# Patient Record
Sex: Male | Born: 1960 | Race: White | Hispanic: No | Marital: Married | Smoking: Former smoker
Health system: Southern US, Community
[De-identification: ages and names within clinical notes are randomized; demographics above are authoritative.]

## PROBLEM LIST (undated history)

## (undated) DIAGNOSIS — N201 Calculus of ureter: Secondary | ICD-10-CM

## (undated) DIAGNOSIS — Z8719 Personal history of other diseases of the digestive system: Secondary | ICD-10-CM

## (undated) DIAGNOSIS — E785 Hyperlipidemia, unspecified: Secondary | ICD-10-CM

## (undated) DIAGNOSIS — Z8669 Personal history of other diseases of the nervous system and sense organs: Secondary | ICD-10-CM

## (undated) DIAGNOSIS — I2510 Atherosclerotic heart disease of native coronary artery without angina pectoris: Secondary | ICD-10-CM

## (undated) DIAGNOSIS — I10 Essential (primary) hypertension: Secondary | ICD-10-CM

## (undated) DIAGNOSIS — K219 Gastro-esophageal reflux disease without esophagitis: Secondary | ICD-10-CM

## (undated) HISTORY — PX: CARDIAC CATHETERIZATION: SHX172

## (undated) HISTORY — DX: Atherosclerotic heart disease of native coronary artery without angina pectoris: I25.10

## (undated) HISTORY — DX: Essential (primary) hypertension: I10

## (undated) HISTORY — PX: CARDIOVASCULAR STRESS TEST: SHX262

## (undated) HISTORY — PX: CORONARY ANGIOPLASTY WITH STENT PLACEMENT: SHX49

## (undated) HISTORY — DX: Personal history of other diseases of the nervous system and sense organs: Z86.69

## (undated) HISTORY — PX: TONSILLECTOMY: SUR1361

## (undated) HISTORY — DX: Hyperlipidemia, unspecified: E78.5

---

## 2001-05-31 ENCOUNTER — Ambulatory Visit (HOSPITAL_COMMUNITY): Admission: RE | Admit: 2001-05-31 | Discharge: 2001-05-31 | Payer: Self-pay | Admitting: Family Medicine

## 2001-05-31 ENCOUNTER — Encounter: Payer: Self-pay | Admitting: Family Medicine

## 2002-07-22 ENCOUNTER — Emergency Department (HOSPITAL_COMMUNITY): Admission: EM | Admit: 2002-07-22 | Discharge: 2002-07-22 | Payer: Self-pay | Admitting: *Deleted

## 2002-07-30 ENCOUNTER — Ambulatory Visit (HOSPITAL_COMMUNITY): Admission: RE | Admit: 2002-07-30 | Discharge: 2002-07-31 | Payer: Self-pay | Admitting: Cardiology

## 2003-03-31 ENCOUNTER — Encounter: Payer: Self-pay | Admitting: Emergency Medicine

## 2003-03-31 ENCOUNTER — Emergency Department (HOSPITAL_COMMUNITY): Admission: EM | Admit: 2003-03-31 | Discharge: 2003-03-31 | Payer: Self-pay | Admitting: Emergency Medicine

## 2003-04-01 ENCOUNTER — Observation Stay (HOSPITAL_COMMUNITY): Admission: EM | Admit: 2003-04-01 | Discharge: 2003-04-02 | Payer: Self-pay | Admitting: Emergency Medicine

## 2003-04-01 ENCOUNTER — Encounter: Payer: Self-pay | Admitting: Emergency Medicine

## 2003-04-01 ENCOUNTER — Ambulatory Visit (HOSPITAL_COMMUNITY): Admission: RE | Admit: 2003-04-01 | Discharge: 2003-04-01 | Payer: Self-pay | Admitting: Emergency Medicine

## 2003-04-01 ENCOUNTER — Encounter (INDEPENDENT_AMBULATORY_CARE_PROVIDER_SITE_OTHER): Payer: Self-pay | Admitting: *Deleted

## 2003-04-01 HISTORY — PX: LAPAROSCOPIC CHOLECYSTECTOMY: SUR755

## 2003-05-15 ENCOUNTER — Encounter: Payer: Self-pay | Admitting: Emergency Medicine

## 2003-05-15 ENCOUNTER — Observation Stay (HOSPITAL_COMMUNITY): Admission: EM | Admit: 2003-05-15 | Discharge: 2003-05-17 | Payer: Self-pay | Admitting: Emergency Medicine

## 2003-07-16 ENCOUNTER — Emergency Department (HOSPITAL_COMMUNITY): Admission: EM | Admit: 2003-07-16 | Discharge: 2003-07-16 | Payer: Self-pay

## 2004-04-12 ENCOUNTER — Ambulatory Visit (HOSPITAL_COMMUNITY): Admission: RE | Admit: 2004-04-12 | Discharge: 2004-04-12 | Payer: Self-pay | Admitting: Cardiology

## 2004-12-08 ENCOUNTER — Emergency Department (HOSPITAL_COMMUNITY): Admission: EM | Admit: 2004-12-08 | Discharge: 2004-12-08 | Payer: Self-pay | Admitting: Family Medicine

## 2007-12-13 ENCOUNTER — Encounter: Admission: RE | Admit: 2007-12-13 | Discharge: 2008-01-10 | Payer: Self-pay | Admitting: Family Medicine

## 2008-07-04 ENCOUNTER — Observation Stay (HOSPITAL_COMMUNITY): Admission: EM | Admit: 2008-07-04 | Discharge: 2008-07-05 | Payer: Self-pay | Admitting: Emergency Medicine

## 2010-06-07 ENCOUNTER — Ambulatory Visit: Payer: Self-pay | Admitting: Cardiology

## 2010-11-01 ENCOUNTER — Ambulatory Visit: Payer: Self-pay | Admitting: Cardiology

## 2010-11-04 ENCOUNTER — Encounter
Admission: RE | Admit: 2010-11-04 | Discharge: 2010-11-04 | Payer: Self-pay | Source: Home / Self Care | Attending: Family Medicine | Admitting: Family Medicine

## 2010-11-10 ENCOUNTER — Encounter
Admission: RE | Admit: 2010-11-10 | Discharge: 2010-11-10 | Payer: Self-pay | Source: Home / Self Care | Attending: Family Medicine | Admitting: Family Medicine

## 2010-11-13 ENCOUNTER — Encounter
Admission: RE | Admit: 2010-11-13 | Discharge: 2010-11-13 | Payer: Self-pay | Source: Home / Self Care | Attending: Family Medicine | Admitting: Family Medicine

## 2011-03-08 NOTE — Discharge Summary (Signed)
[name], [name] NO.:  [id number]   MEDICAL RECORD NO.:  [id number]          PATIENT TYPE:  OBV   LOCATION:  3707                         FACILITY:  [facility]   PHYSICIAN:  Vesta Mixer, M.D. DATE OF BIRTH:  [birth year]   DATE OF ADMISSION:  07/04/2008  DATE OF DISCHARGE:  07/05/2008                               DISCHARGE SUMMARY   DISCHARGE DIAGNOSES:  1. Noncardiac chest pain.  2. History of coronary artery disease - status post stenting in the      past.  3. Dyslipidemia.   DISCHARGE MEDICATIONS:  1. Lipitor 80 mg at night.  2. Niaspan 1000 mg a day.  3. Toprol-XL 50 mg a day.  4. Plavix 75 mg a day.  5. Aspirin 81 mg a day.  6. Omeprazole 20 mg a day or alternatively.  7. Prevacid 30 mg a day.  8. Fish oil capsules once a day.  9. Men's multivitamin once a day.  10.Tylenol as needed.   DISPOSITION:  The patient will see Dr. Swaziland in the next several weeks.   HISTORY:  Mr. Tim Heath is a 50 year old gentleman with history of coronary  artery disease.  He was admitted for observation last night after having  some episodes of chest pain.  Please see dictated H&P for further  details.   HOSPITAL COURSE:  Chest pain.  The patient was ruled out for myocardial  infarction.  He had no EKG changes.  He is discharged in satisfactory  condition.  We have given him a prescription for Prevacid 30 mg a day  which may be better than the omeprazole in terms of interactions with  Plavix.  He will return to see Dr. Swaziland in several weeks.      Vesta Mixer, M.D.  Electronically Signed     PJN/MEDQ  D:  07/05/2008  T:  07/06/2008  Job:  [id number]   cc:   Peter M. Swaziland, M.D.

## 2011-03-08 NOTE — H&P (Signed)
Tim Heath, LAUDER NO.:  [id number]   MEDICAL RECORD NO.:  [id number]          PATIENT TYPE:  EMS   LOCATION:  MAJO                         FACILITY:  [facility]   PHYSICIAN:  Vesta Mixer, M.D. DATE OF BIRTH:  [birth year]   DATE OF ADMISSION:  07/04/2008  DATE OF DISCHARGE:                              HISTORY & PHYSICAL   HISTORY:  Tim Heath is a 50 year old gentleman with a history of  known coronary artery disease.  He is admitted today with several hours  of atypical chest pain.   Tim Heath has a history of PTCA and stenting of his right coronary  artery in 2003 and his ramus intermediate vessel in 2004.  He has done  fairly well.  Cardiac catheterization in 2005 revealed nonobstructive  disease.  He has overall done fairly well.  I do not have any history of  his evaluation over the past couple of years, but he has not needed  hospitalization.   Today, he was at work and had onset of chest pain.  The pain lasted for  3 hours.  He did not have any sublingual nitroglycerin.  He presented to  the ER, where his pain was quickly relieved with sublingual  nitroglycerin.   He is now admitted for further evaluation and observation.   CURRENT MEDICATIONS:  1. Niaspan 1000 mg a day.  2. Toprol-XL 50 mg a day.  3. Lipitor 80 mg a day.  4. Plavix 75 mg a day.  5. Aspirin 81 mg a day.  6. Omeprazole 20 mg a day.  7. Fish oil 1000 mg a day.   ALLERGIES:  None.   PAST MEDICAL HISTORY:  1. Hyperlipidemia.  2. Hypertension.  3. Coronary artery disease - status post PTCA and stenting of his      right coronary artery and his ramus intermediate vessel.   FAMILY HISTORY:  Noncontributory.   REVIEW OF SYSTEMS:  Reviewed and is essentially negative except as noted  in the HPI.   PHYSICAL EXAMINATION:  GENERAL:  He is a middle-aged gentleman in no  acute distress.  He is alert and oriented x3 and his mood and affect are  normal.  VITAL SIGNS:  His heart  rate 60 and blood pressure 129/86.  NECK:  2+ carotids.  No bruits.  No JVD.  No thyromegaly.  LUNGS:  Clear.  HEART:  Regular rate.  S1 and S2.  ABDOMEN:  Good bowel sounds, nontender.  EXTREMITIES:  There is no clubbing, cyanosis, or edema.  NEUROLOGIC:  Nonfocal.   His EKG reveals normal sinus rhythm.  He has some early repolarization  changes, which were present on his previous EKG in 2004.   His cardiac enzymes are negative x1.   Tim Heath presents with some atypical episodes of chest pain.  He did  not have his nitroglycerin, but the pain was fairly easily relieved here  in the emergency room.  We will admit him to the hospital for further  evaluation and check cardiac enzymes.  If his enzymes are negative, I  think he can go home tomorrow.  If they are abnormal, then he will need  to stay for cardiac catheterization.  All of his other medical problems  remain stable.      Vesta Mixer, M.D.  Electronically Signed     PJN/MEDQ  D:  07/04/2008  T:  07/05/2008  Job:  [id number]   cc:   Peter M. Swaziland, M.D.

## 2011-03-11 NOTE — Discharge Summary (Signed)
Tim Heath, Tim Heath                        ACCOUNT NO.:  [id number]   MEDICAL RECORD NO.:  [id number]                   PATIENT TYPE:  OIB   LOCATION:  6525                                 FACILITY:  [facility]   PHYSICIAN:  Peter M. Swaziland, M.D.               DATE OF BIRTH:  1961-03-10   DATE OF ADMISSION:  07/30/2002  DATE OF DISCHARGE:  07/31/2002                                 DISCHARGE SUMMARY   HISTORY OF PRESENT ILLNESS:  The patient is a 50 year old male with family  history of heart disease, history of hyperlipidemia, who presents with one-  year history of intermittent chest pain and left shoulder pain.  Subsequent  stress Cardiolite study was abnormal showing evidence of inferior and apical  ischemia.   For details of his Past Medical History, Social History, Family History, and  Physical Examination, please see admission History and Physical.   LABORATORY DATA:  Previous chest x-ray showed bibasilar subsegmental  atelectasis and scar.   ECG last was normal.   Coags were normal.  BMET was normal.  CBC was normal.  His lipid panel  showed an LDL of 148.   HOSPITAL COURSE:  The patient underwent cardiac catheterization.  This  demonstrated chronic total occlusion of the proximal right coronary artery.  There was good collateral flow from the left coronary system.  The left  coronary artery showed 30 to 40% disease in the proximal LAD with 20%  irregularity in the left circumflex coronary artery.  Left ventricular  function was normal.  The patient underwent successful intervention of the  right coronary artery.  A stent was placed in the proximal to mid right  coronary artery with a 3.0 x 28 mm Cypher drug-eluting stent.  This yielded  excellent angiographic result with 0% residual stenosis and excellent TIMI-3  flow down the right coronary artery.  Post procedure, the patient was  treated with aspirin, Plavix, and IV Integrilin for 18 hours.  He did have  moderate  groin bruising but no significant hematoma and no bruit.  CBC  remained stable.  His ECG remained normal.  The patient was discharged home  in stable condition on 07/31/2002.   DISCHARGE DIAGNOSES:  1. Atherosclerotic coronary artery disease with chronic total occlusion of     the right coronary artery now status post successful stenting.  2. Hypercholesterolemia.  3. Family history of coronary disease.  4. Obesity.   DISCHARGE MEDICATIONS:  1. Coated aspirin 325 mg per day.  2. Plavix 75 mg daily for at least 3 months.  3. Lipitor 80 mg q.d.  4. Nitroglycerin p.r.n.   DIAGNOSIS:  The patient was instructed to take a low-fat diet.   ACTIVITY:  He is to avoid lifting or straining for five days.    FOLLOW UP:  Follow up with Dr. Swaziland on Tuesday, August 06, 2002.   CONDITION ON DISCHARGE:  Improved.  Peter M. Swaziland, M.D.    PMJ/MEDQ  D:  07/31/2002  T:  08/02/2002  Job:  [id number]   cc:   Stacie Acres. Cliffton Asters, M.D.

## 2011-03-11 NOTE — H&P (Signed)
NAMEJOZEPH, PERSING                        ACCOUNT NO.:  [id number]   MEDICAL RECORD NO.:  [id number]                   PATIENT TYPE:  OBV   LOCATION:  6529                                 FACILITY:  [facility]   PHYSICIAN:  Quita Skye. Waldon Reining, MD             DATE OF BIRTH:  1961/09/15   DATE OF ADMISSION:  05/15/2003  DATE OF DISCHARGE:                                HISTORY & PHYSICAL   HISTORY OF PRESENT ILLNESS:  Tim Heath is a 50 year old white man who  is admitted to [facility] for further evaluation of chest pain.   The patient has a history of coronary artery disease which dated back to  October of 2003.  At that time, he had a positive stress test after  presenting with complaints of chest pain.  He subsequently underwent cardiac  catheterization and deployment of a stent in the right coronary artery.  His  course has been asymptomatic since that time.   However, while sitting at home this afternoon, he experienced the onset of  chest discomfort.  This was described as a pressure in the left anterior  chest.  It was also associated with an ache in his left shoulder, an ache in  his left elbow, and tingling in his left hand.  There were no exacerbating  or ameliorating factors.  The discomfort appeared not to be related to  position, activity, meals, or respirations.  There was no associated  dyspnea, diaphoresis, or nausea.  The symptoms waxed and waned over the  ensuing five hours and eventually resolved spontaneously.  He is free of  symptoms at this time.  The patient noted that this discomfort is the same  as that which preceded his stent deployment.   PAST MEDICAL HISTORY:  The patient has no history of myocardial infarction,  congestive heart failure, or arrhythmia.   The patient has a history of hypertension and hyperlipidemia, both of which  are being treated.   HABITS:  He stopped smoking about 18 years ago.   FAMILY HISTORY:  There is a strong  family history of coronary artery  disease.  His sister suffered from coronary artery disease in her 32s.  His  mother suffered from coronary artery disease at an early age as well.  Family history is otherwise noncontributory.   ALLERGIES:  The patient is not allergic to any medications.   CURRENT MEDICATIONS:  1. Lipitor 80 mg p.o. daily.  2. Toprol-XL 50 mg p.o. daily.  3. Aspirin 325 mg p.o. daily.   SOCIAL HISTORY:  The patient is married and lives with his wife.  He works  on a loading dock.   REVIEW OF SYSTEMS:  Review of systems reveals no new problems related to his  head, eyes, ears, nose, mouth, throat, lungs, gastrointestinal system,  genitourinary system, or extremities.  There is no history of a neurologic  or psychiatric disorder.   PHYSICAL  EXAMINATION:  VITAL SIGNS:  Blood pressure 121/66.  Pulse is 61 and  regular.  Respirations 16.  Temperature 97.7.  GENERAL:  The patient was a middle-aged white man in no discomfort.  He is  alert, oriented, appropriate, and responsive.  HEENT:  Head, eyes, nose and mouth were unremarkable.  NECK:  The neck was without thyromegaly or adenopathy.  Carotid pulses were  palpable bilaterally and without bruits.  CARDIAC:  Examination revealed a normal S1 and S2.  There was no S3, S4,  murmur, rub, or click.  Cardiac rhythm was regular.  No chest wall  tenderness was noted.  LUNGS:  The lungs were clear.  ABDOMEN:  The abdomen was soft and nontender.  There was no mass,  hepatosplenomegaly, bruit, distention, rebound, guarding, or rigidity.  Bowel sounds were normal.  RECTAL AND GENITAL:  Examinations were not performed as they were not  pertinent for the reason for acute care hospitalization.  EXTREMITIES:  The extremities were without edema, deviation or deformity.  Radial and dorsalis pedal pulses were palpable bilaterally.  NEUROLOGIC:  Brief screening neurologic survey was unremarkable.   LABORATORY AND ACCESSORY CLINICAL  DATA:  The electrocardiogram was normal.   The chest radiograph report was pending at the time of this dictation.   The first set of cardiac enzymes revealed the CK-MB of less than 1, a  myoglobin of 47.1, and troponin of less than 0.05.  The second set revealed  a CK-MB of less than 1, a myoglobin of 72.3, and a troponin of less than  0.05.  The third set revealed a CK-MB of less than 1, a myoglobin of 62.9,  and troponin of less than 0.05.  Potassium is 5.2, BUN 17, and creatinine  1.0.  The remaining studies are pending at the time of this dictation.   IMPRESSION:  1. Chest pain, rule out restenosis.  2. Coronary artery disease; status post right coronary artery stent, October     2003.  3. Hypertension.  4. Hyperlipidemia.  5. Status post cholecystectomy one month ago.   PLAN:  1. Telemetry.  2. Serial cardiac enzymes.  3. Aspirin.  4. Lovenox.  5. Nitrol paste.  6. Additional cardiac testing per Dr. Peter M. Swaziland.                                               Quita Skye. Waldon Reining, MD    MSC/MEDQ  D:  05/15/2003  T:  05/16/2003  Job:  [id number]

## 2011-03-11 NOTE — H&P (Signed)
[name], [name]                        ACCOUNT NO.:  [id number]   MEDICAL RECORD NO.:  [id number]                   PATIENT TYPE:  OIB   LOCATION:                                       FACILITY:  [facility]   PHYSICIAN:  Peter M. Swaziland, M.D.               DATE OF BIRTH:  01-25-1961   DATE OF ADMISSION:  04/12/2004  DATE OF DISCHARGE:                                HISTORY & PHYSICAL   HISTORY OF PRESENT ILLNESS:  Tim Heath is a 50 year old Heath male with  history of coronary artery disease, who presents with symptoms of substernal  chest pressure with exertion and left arm and hand tingling.  It is  associated with some breathlessness.  He does have a very physical job and  he has noticed this predominantly when he is having to move a heavy load.  These symptoms have increased in the past several days.  He describes his  chest discomfort as predominantly a tightness.  Tim Heath has a known  history of coronary artery disease.  He is status post stenting of a right  coronary artery total occlusion in October of 2003 with a 3.0 x 28.0-mm  CYPHER drug-eluting stent.  In July of 2004, he had stent in the  intermediate vessel using a 2.5 x 16.0-mm TAXUS stent.  He had a followup  stress Cardiolite study in December of 2004, at which time he was  asymptomatic and there were some equivocal changes anteriorly.  Given his  current symptoms, it is recommended he undergo cardiac catheterization.   PAST MEDICAL HISTORY:  1. Obesity.  2. Hyperlipidemia.  3. Hypertension.   MEDICATIONS:  1. Lipitor 80 mg per day.  2. Niaspan 1000 mg nightly.  3. Multivitamin once daily.  4. Toprol-XL 50 mg per day.  5. Aspirin 325 mg per day.   ALLERGIES:  He has no known allergies.   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  He is married with 2 daughters.  He has lost 29 pounds with diet and  exercise of the past few months.  He quit smoking 18 years  ago.  He denies  alcohol use.   FAMILY HISTORY:  Father died at age 64 with emphysema.  Mother died at age  23 with liver cancer.  One brother has coronary disease and hypertension.  Three siblings are alive and well.  Two sisters died in a motor vehicle  accident.   REVIEW OF SYSTEMS:  His review of systems is otherwise negative.   PHYSICAL EXAM:  GENERAL:  On physical exam, the patient is a pleasant Heath  male in no apparent distress.  VITAL SIGNS:  Blood pressure is 130/80, pulse 80 and regular.  Weight is  205.  HEENT:  Pupils are equal, round and reactive.  Conjunctivae are clear.  Oropharynx is clear.  NECK:  Neck  is without JVD, adenopathy, thyromegaly or bruits.  LUNGS:  Lungs are clear.  CARDIAC:  Exam reveals regular rate and rhythm, normal S1 and S2, without  gallop, murmur, rub or click.  ABDOMEN:  Abdomen is obese, soft and nontender.  His bowel sounds are  positive.  There are no masses.  EXTREMITIES:  Extremities are without edema.  Pulses are 2+ and symmetric.  NEUROLOGIC:  Exam is nonfocal.   LABORATORY DATA:  Chest x-ray shows no active disease.   ECG is normal.   Coagulations are normal.  Chemistry panel shows a BUN of 21, creatinine of  0.9.  His cholesterol was 101, triglycerides 56, HDL was 38, LDL of 52.  All  other chemistries were normal and CBC is normal.   IMPRESSION:  1. Recurrent chest pain, need to rule out ischemia.  2. Status post stenting of the right coronary artery in October of 2003 and     stenting of an intermediate branch in July of 2004.  3. Obesity.  4. Hypertension.  5. Hyperlipidemia.   PLAN:  We will proceed with cardiac catheterization with therapy pending  these results.                                                Peter M. Swaziland, M.D.    PMJ/MEDQ  D:  04/11/2004  T:  04/12/2004  Job:  [id number]   cc:   Tim Heath, M.D.  [address]  [place]  [place] [address]  Fax: [phone]

## 2011-03-11 NOTE — Op Note (Signed)
Tim Heath, Tim Heath                        ACCOUNT NO.:  [id number]   MEDICAL RECORD NO.:  [id number]                   PATIENT TYPE:  INP   LOCATION:  5733                                 FACILITY:  [facility]   PHYSICIAN:  Sharlet Salina T. Hoxworth, M.D.          DATE OF BIRTH:  [birth year]   DATE OF PROCEDURE:  04/01/2003  DATE OF DISCHARGE:                                 OPERATIVE REPORT   PREOPERATIVE DIAGNOSIS:  Acute cholecystitis.   POSTOPERATIVE DIAGNOSIS:  Acute cholecystitis.   OPERATION PERFORMED:  Laparoscopic cholecystectomy.   SURGEON:  Lorne Skeens. Hoxworth, M.D.   ASSISTANT:  Gabrielle Dare. Janee Morn, M.D.   ANESTHESIA:  General.   INDICATIONS FOR PROCEDURE:  The patient is a 50 year old white male who  presents with 24 to 48 hours of right upper quadrant abdominal pain and  nausea.  Work-up includes a gallbladder ultrasound showing a diffusely  thickened gallbladder wall but no stones.  Liver function tests were  essentially normal.  He is felt to have acute cholecystitis, possibly  acalculous.  Laparoscopic cholecystectomy with cholangiogram has been  recommended and accepted.  The nature of the procedure, its indications and  risks of bleeding, infection, bile leak and bile duct injury were discussed  and understood preoperatively.  He is now brought to the operating room for  this procedure.   DESCRIPTION OF PROCEDURE:  The patient was brought to the operating room and  placed in supine position on the operating table and general endotracheal  anesthesia was induced.  He received preoperative antibiotics.  The abdomen  was sterilely prepped and draped.  Local anesthesia was used to infiltrate  the trocar sites prior to the incisions.  A 1 cm incision was made at the  umbilicus.  Dissection was carried down to the midline fascia which was  sharply incised for 1 cm and the peritoneum entered under direct vision.  Through a mattress suture of 0 Vicryl, the Hasson trocar  was placed and a  pneumoperitoneum established.  Under direct vision a 10 mm trocar was placed  in the subxiphoid area and two 5 mm trocars in the right subcostal margin.  The gallbladder was visualized and was acutely edematous and inflamed.  The  fundus was grasped and elevated over the liver and the infundibulum  retracted inferolaterally.  Fibrofatty tissue was stripped down off the neck  of the gallbladder toward the porta hepatis.  There was a lot of edema but  the dissection progressed easily.  The cystic artery was identified coursing  up the gallbladder wall  and was divided between two proximal and distal  clips.  The distal gallbladder in Calot's triangle was thoroughly dissected  and the cystic duct identified and the cystic duct gallbladder junction  delineated.  The cystic duct was very small.  It was clipped at the  gallbladder junction.  The cystic duct was opened in preparation for  cholangiogram but the lumen  was very tiny and would not begin to admit the  Cholangiocath.  We elected to abort the cholangiogram for this reason and  the proximal duct was doubly clipped and the duct divided.  Following this,  the gallbladder was dissected free from its bed using a hook cautery and it  was removed intact through the umbilicus.  Complete hemostasis was obtained  in the gallbladder bed.  The trocar was removed under  direct vision and all CO2 evacuated from the peritoneal cavity.  The  mattress suture was secured at the umbilicus.  Skin incisions were closed  with interrupted subcuticular 4-0 Monocryl and Steri-Strips.  The patient  was then transferred to the recovery room in good condition.                                                Lorne Skeens. Hoxworth, M.D.    Tory Emerald  D:  04/01/2003  T:  04/02/2003  Job:  [id number]

## 2011-03-11 NOTE — H&P (Signed)
**Note Tim Heath** [name], Tim Heath                        ACCOUNT NO.:  [id number]   MEDICAL RECORD NO.:  [id number]                   PATIENT TYPE:  INP   LOCATION:  1856                                 FACILITY:  [facility]   PHYSICIAN:  Sharlet Salina T. Hoxworth, M.D.          DATE OF BIRTH:  08/20/1961   DATE OF ADMISSION:  04/01/2003  DATE OF DISCHARGE:                                HISTORY & PHYSICAL   CHIEF COMPLAINT:  Abdominal pain, nausea, vomiting.   HISTORY OF PRESENT ILLNESS:  Mr. Tim Heath is a 50 year old white male who  presented yesterday to [facility] Room with the abrupt onset of  right upper quadrant abdominal pain, associated with nausea and vomiting.  This is not particularly related to eating.  No fever, chills or jaundice.  The pain was sharp and constant.  He was evaluated in the emergency room and  found to have right upper quadrant tenderness.  CT scan of the abdomen was  obtained.  At that time, it was negative.  Gallbladder ultrasound was  obtained today as an outpatient and he returns for evaluation.  His pain has  improved somewhat, but he remains sore in the right upper quadrant.  He has  low grade nausea without vomiting.  No fever or chills.  He has had several  similar episodes over the last two years but not as severe.  No relation to  food or physical activity particularly.  No fever, chills, jaundice.  No GU  symptoms.   PAST SURGICAL/MEDICAL HISTORY:  Significant only for tonsillectomy.  He has  history of coronary artery disease and underwent PTCA of the right coronary  artery in October by Dr. Swaziland.  He has elevated cholesterol.   MEDICATIONS:  1. Toprol XL 50 mg a day.  2. Lipitor 1 daily.  3. Enteric coated aspirin 325 mg daily.   ALLERGIES:  No known drug allergies.   SOCIAL HISTORY:  Married.  No cigarette or alcohol use.  Works at a loading  dock.   FAMILY HISTORY:  Mother died of liver cancer.   REVIEW OF SYSTEMS:  GENERAL:  No fever,  chills, weight change.  RESPIRATORY:  No shortness of breath, cough, wheezing.  CARDIAC:  No recent chest pain,  palpitations.  No history of MI.  No ankle edema.  ABDOMEN:  As above.   PHYSICAL EXAMINATION:  VITAL SIGNS:  Temperature 98.6, pulse 62 and regular,  respirations 20, blood pressure 137/79.  GENERAL:  Well-nourished, well-developed white male in no acute distress.  SKIN:  Warm and dry without rash or infection.  HEENT:  No palpable masses or thyromegaly.  Sclerae nonicteric.  LUNGS:  Clear to auscultation.  CARDIAC:  Regular rate and rhythm without murmurs.  NECK:  No JVD or edema.  ABDOMEN:  Mild right upper quadrant tenderness.  No guarding, masses or  organomegaly.  EXTREMITIES:  No edema.  No joint swelling.  NEUROLOGIC:  Alert  and oriented, motor and sensory exam, normal.   LABORATORY DATA:  White count elevated at 11.9 thousand, hemoglobin normal.  Electrolytes and LFT's all essentially normal, except for a minimally  elevated ST-T of 44, amylase and lipase normal.   X-rays, CT scan of the abdomen and pelvis yesterday are negative.   Ultrasound of the abdomen today shows diffusely thickened gallbladder wall  with no stones.  Common bile duct appears normal.   ASSESSMENT/PLAN:  Apparent acute cholecystitis, possibly acalculous.  Options were discussed with the patient.  Will plan to proceed today with  laparoscopic cholecystectomy with cholangiogram.  Procedure, indications and  risks were discussed.                                                Lorne Skeens. Hoxworth, M.D.    Tory Emerald  D:  04/01/2003  T:  04/01/2003  Job:  [id number]

## 2011-03-11 NOTE — Cardiovascular Report (Signed)
NAMELARON, BOORMAN                        ACCOUNT NO.:  [id number]   MEDICAL RECORD NO.:  [id number]                   PATIENT TYPE:  OIB   LOCATION:  6525                                 FACILITY:  [facility]   PHYSICIAN:  Peter M. Swaziland, M.D.               DATE OF BIRTH:  December 06, 1960   DATE OF PROCEDURE:  07/30/2002  DATE OF DISCHARGE:  07/31/2002                              CARDIAC CATHETERIZATION   INDICATIONS FOR PROCEDURE:  The patient is a 50 year old white male who  presents with symptoms of exertional angina.  He has an abnormal stress  Cardiolite study showing evidence of inferior wall ischemia.   ACCESS:  Via the right femoral artery using the standard Seldinger  technique.   EQUIPMENT:  The #6 French 4-cm right and left Judkins catheters, #6 French  pigtail catheter, #6 French arterial sheath, #7 [place] arterial sheath, #7  [place] guide with sideholes, and 0.014 Cross-It XT 200 wire, a 3.0 x 15-  mm Maverick over-the-wire balloon, and a 3.0 x 28-mm Cypher drug-eluting  stent.   CONTRAST:  Omnipaque, 250 cc.   MEDICATIONS:  Versed 2 mg IV, double bolus Integrilin at 0.18 mg/kg followed  by continuous infusion 2 mcg/kg per minute, nitroglycerin 200 mcg  intracoronary x3, heparin 5000 units IV, morphine 2 mg IV, and Plavix 300 mg  p.o.   HEMODYNAMIC DATA:  1. Aortic pressure was 135/83 with a mean of 107.  2. Left ventricular pressure was 142 with an EDP of 25.   ANGIOGRAPHIC DATA:  1. The left coronary artery arises and distributes normally.  2. The left main coronary artery was normal.  3. The left anterior descending artery had mild disease proximally up to 30-     40%.  There was also 30% narrowing in a second diagonal branch.  4. The left circumflex coronary artery demonstrated 20% irregularities in     the first marginal branch.  Otherwise no obstructive disease.  5. The right coronary artery was occluded in the proximal to mid vessel.     There were  excellent collaterals from the left coronary system filling     all the way up to the level of occlusion.  6. Left ventricular angiography performed in the RAO view demonstrates     normal left ventricular size and contractility with normal systolic     function.  Ejection fraction is estimated at 65%.  No mitral     regurgitation or prolapse is seen.   INTERVENTIONAL PROCEDURE:  We proceeded at this point to dilate the occluded  right coronary artery.  Using the above equipment, we initially obtained  guide shots.  Intracoronary nitroglycerin did not open the occluded vessel.  We then attempted to cross with a Hi-Torque Floppy wire without success.  The Cross-It XT 200 did cross successfully.  We then predilated the lesion  with the 3.0 x 15-mm Maverick balloon dilating up  to 6 atmospheres and 8  atmospheres.  Prior to balloon dilatation, we did confirm intraluminal  position by injecting contrast through the wire port of the balloon  catheter.  After balloon inflation, it was noted that there was a segmental  area of stenosis.  This was successfully stented using a 3.0 x 28-mm Cypher  drug-eluting stent and post dilating it to 15 atmospheres.  This yielded an  excellent angiographic result with 0% residual stenosis and TIMI grade 3  flow through the distal right coronary artery.  The distal right coronary  artery had only mild wall irregularities.   FINAL INTERPRETATION:  1. Single-vessel occlusive atherosclerotic coronary artery disease.  2. Normal left ventricular function.  3. Successful stenting of the proximal to mid right coronary artery using a     Cypher drug-eluting stent.   PLAN:  Continue aspirin and Plavix for at least three months.                                               Peter M. Swaziland, M.D.    PMJ/MEDQ  D:  08/01/2002  T:  08/04/2002  Job:  [id number]   cc:   Stacie Acres. Cliffton Asters, M.D.

## 2011-03-11 NOTE — Cardiovascular Report (Signed)
Tim Heath, EDEN                        ACCOUNT NO.:  [id number]   MEDICAL RECORD NO.:  [id number]                   PATIENT TYPE:  OBV   LOCATION:  6529                                 FACILITY:  [facility]   PHYSICIAN:  Peter M. Swaziland, M.D.               DATE OF BIRTH:  April 01, 1961   DATE OF PROCEDURE:  05/16/2003  DATE OF DISCHARGE:  05/17/2003                              CARDIAC CATHETERIZATION   INDICATION FOR PROCEDURE:  The patient is a 50 year old white male with a  history of coronary artery disease, status post prior stenting of chronic  total occlusion of the right coronary artery in October 2003.  He presents  with recurrent unstable angina.   ACCESS:  Access is via the right femoral artery using standard Seldinger  technique.   EQUIPMENT:  6 French 4 cm right and left Judkins catheters, 6 French pigtail  catheter, 6 French arterial sheath, 6 [place] JL4 guide, 0.014 high-torque  floppy wire, a 2.5 x 15 mm CrossSail balloon and a 2.5 x 16 mm Taxus Express  II drug-eluting stent.   CONTRAST:  200 mL of Omnipaque.   MEDICATIONS:  Heparin 5000 units IV, Versed 2 mg IV, Integrilin double bolus  at 0.18 mg/kg followed by continuous infusion at 2.24 mcg/kg per minute,  nitroglycerin 200 mcg intracoronary, Plavix 300 mg p.o.   HEMODYNAMIC DATA:  1. Aortic pressure was 98/61 with a mean of 77.  2. Left ventricular pressure was 124 with EDP of 19.   ANGIOGRAPHIC DATA:  1. The left coronary arises and distributes normally.  2. The left main coronary artery appears normal.  3. Left anterior descending artery has somewhat diffuse mild irregularities.     There was as 40-50% stenosis in the proximal LAD at the takeoff of the     first septal perforator.  The first diagonal branch has a 50% stenosis in     the midvessel.  4. There was a large intermediate branch, which has a 90% stenosis     segmentally in the proximal vessel.  5. The left circumflex coronary artery  appears to be without significant     disease.  6. The right coronary artery arises and distributes normally.  It is a     dominant vessel.  It has scattered 20% disease throughout the vessel.     The previous stent in the midvessel is widely patent.  7. The left ventricular angiography in the RAO view demonstrated normal left     ventricular size and contractility with normal systolic function.     Ejection fraction is estimated at 65%.  There is no mitral regurgitation     or prolapse.   We proceeded at this point with intervention of the large intermediate  branch.  This lesion was crossed easily and pre-dilated using the 2.5 mm  CrossSail balloon up to 8 atmospheres.  We then placed the 2.5 x  16 mm Taxus  stent and post-dilated that to 14 atmospheres.  This revealed an excellent  angiographic result with 0% residual stenosis and TIMI grade 3 flow.  The  patient had no complications.   FINAL INTERPRETATION:  1. Single-vessel obstructive atherosclerotic coronary artery disease with a     new lesion in the intermediate branch.  2.     Continued long-term patency of the previous stent in the right coronary     artery.  3. Normal left ventricular function.  4. Successful stenting of the proximal intermediate branch.                                               Peter M. Swaziland, M.D.    PMJ/MEDQ  D:  05/16/2003  T:  05/18/2003  Job:  [id number]  Stacie Acres. White, M.D.  [address]  [place]  [place] [address]  Fax: [phone]   cc:   Stacie Acres. White, M.D.  [address]  [place]  [place] [address]  Fax: [phone]

## 2011-03-11 NOTE — H&P (Signed)
Tim Heath, Tim Heath                        ACCOUNT NO.:  [id number]   MEDICAL RECORD NO.:  [id number]                   PATIENT TYPE:  OIB   LOCATION:  6525                                 FACILITY:  [facility]   PHYSICIAN:  Peter M. Swaziland, M.D.               DATE OF BIRTH:  05/09/1961   DATE OF ADMISSION:  07/30/2002  DATE OF DISCHARGE:                                HISTORY & PHYSICAL   HISTORY OF PRESENT ILLNESS:  The patient is a 50 year old white male who has  been experiencing symptoms of intermittent chest pain, left shoulder and  neck pain for the past year.  The patient states he had x-rays done of his  neck and shoulder that were negative.  He has been tried on anti-  inflammatory and pain medications without benefit.  Over the past three to  four months, his symptoms have been progressive and are clearly now  exertional.  He describes a mid substernal chest pain associated with  shortness of breath and radiation to his left arm and shoulder.  He also has  complaints of increased fatigability.  Because of this, the patient was  referred for a stress Cardiolite study which was performed on July 26, 2002.  He was able to exercise for six minutes on the Bruce protocol.  At  this level he experiences typical anginal symptoms and had 2 mm of ST  segment depression inferolaterally.  His Cardiolite images also demonstrated  evidence of inferior ischemia as well as a defect at the apex.  His ejection  fraction was 58%.  Based on these results, cardiac catheterization has been  recommended.   PAST MEDICAL HISTORY:  This is significant for hypercholesterolemia,  obesity, history of Bell's Palsy.   PAST SURGICAL HISTORY:  He has had prior tonsillectomy and vasectomy.   ALLERGIES:  No known drug allergies.   CURRENT MEDICATIONS:  1. Enteric coated aspirin daily.  2. Lipitor 40 mg per day.  3. Naproxen p.r.n.  4. Darvocet-N 100 p.r.n.  5. Nexium 40 mg per day.   SOCIAL  HISTORY:  The patient works on a loading dock.  He is married and has  two healthy children.  He quit smoking 16 years ago and previously was a one-  pack-per-day smoker.  He denies alcohol use.   FAMILY HISTORY:  His father died at age 16 of emphysema.  Mother died at age  44 with liver cancer.  One brother is age 67, has had coronary artery  disease and hypertension.  Two sisters died in a motor vehicle accident.  Three siblings are currently alive and well.   REVIEW OF SYSTEMS:  The patient denies any claudication.  He has had no  orthopnea or PND.  He denies any TIA or CVA symptoms.  No recent bowel or  bladder complaints.  Other review of systems are negative.   PHYSICAL EXAMINATION:  GENERAL APPEARANCE:  The patient is an obese white  male in no apparent distress.  VITAL SIGNS:  Weight is 245, blood pressure is 148/94, pulse 74 and regular.  HEENT:  Pupils are equal, round and reactive.  Conjunctivae are clear.  Oropharynx is clear.  NECK:  Supple without JVD, adenopathy, thyromegaly or bruits.  LUNGS:  Clear to auscultation and percussion.  CARDIOVASCULAR:  Regular rate and rhythm, normal S1 and S2 without gallops,  murmurs, rubs or clicks.  ABDOMEN:  Soft, obese and nontender, no hepatosplenomegaly, masses or  bruits.  EXTREMITIES:  Femoral and pedal pulses are 2+ and symmetric.  NEUROLOGIC:  Nonfocal.   LABORATORY DATA:  Resting ECG demonstrates normal sinus rhythm, normal ECG.   Other labs are pending.   IMPRESSION:  1. Symptoms of angina pectoris with abnormal stress Cardiolite study at the     low level.  2. Obesity.  3. Hypercholesterolemia.  4. Borderline hypertension.  5. Family history of coronary artery disease.   PLAN:  The patient will be admitted for cardiac catheterization with further  therapy pending these results.                                                Peter M. Swaziland, M.D.    PMJ/MEDQ  D:  07/29/2002  T:  07/31/2002  Job:  [id number]    cc:   Stacie Acres. Cliffton Asters, M.D.

## 2011-03-11 NOTE — Cardiovascular Report (Signed)
Tim Heath                        ACCOUNT NO.:  [id number]   MEDICAL RECORD NO.:  [id number]                   PATIENT TYPE:  OIB   LOCATION:  2899                                 FACILITY:  [facility]   PHYSICIAN:  Tim Heath, M.D.               DATE OF BIRTH:  1961/03/04   DATE OF PROCEDURE:  04/12/2004  DATE OF DISCHARGE:  04/12/2004                              CARDIAC CATHETERIZATION   PROCEDURE PERFORMED:  Cardiac catheterization.   CARDIOLOGIST:  Tim Heath, M.D.   INDICATIONS FOR PROCEDURE:  Tim Heath is a 50 year old Heath male with  history of coronary artery disease status post prior stenting of the right  coronary artery in October 2003 and stenting of the intermediate branch in  July 2004 who presents now with recurrent chest pain.   ACCESS:  Access is via the right femoral artery using the standard Seldinger  technique.   EQUIPMENT:  Six French 4 cm right and left Judkins catheters. Six French  pigtail catheter.  Six French arterial sheath.   MEDICATIONS:  Local anesthesia with 1% lidocaine.   CONTRAST MATERIAL:  One-hundred-ten milliliters Omnipaque.   HEMODYNAMIC DATA:  Aortic pressure 117/65 with a mean of 89 mmHg.  Left  ventricular pressure 111 with an EDP of 25 mmHg.   ANGIOGRAPHIC DATA:  Left Coronary Artery:  The left coronary artery rises  and distributes normally.   Left Main Coronary Artery:  The left main coronary artery appears normal.   Left Anterior Descending Artery:  The left anterior descending artery has 30-  40% narrowing at the takeoff of the first septal perforator.  The remainder  of the vessel has mild diffuse irregularities.  The first diagonal branch  has 20% narrowing proximally.   There is a large intermediate branch, which is widely patent at the previous  stent site. There is no obstructive disease.   Left Circumflex Coronary Artery:  The left circumflex coronary artery  appears normal.   Right Coronary  Artery:  The right coronary artery is a dominant vessel.  It  has diffuse 10-20% irregularities throughout the vessel, but the stent is  still widely patent.   Left Ventricular Angiography:  Left ventricular angiography was performed in  the RAO view.  This demonstrated normal left ventricular size and  contractility with normal systolic function.  Ejection fraction is estimated  at 55%.  There is no mitral regurgitation or prolapse.   FINAL INTERPRETATION:  1. Nonobstructive coronary artery disease.  2. Continued excellent patency of the stents in the intermediate branch and     right coronary artery.  3. Normal left ventricular function.   PLAN:  We will continue medical therapy.  Tim Heath, M.D.    PMJ/MEDQ  D:  04/12/2004  T:  04/13/2004  Job:  [id number]   cc:   Tim Heath, M.D.  [address]  [place]  [place] [address]  Fax: [phone]

## 2011-04-05 ENCOUNTER — Emergency Department (HOSPITAL_COMMUNITY)
Admission: EM | Admit: 2011-04-05 | Discharge: 2011-04-05 | Disposition: A | Discharge status: Home / Self Care | Payer: Worker's Compensation | Attending: Emergency Medicine | Admitting: Emergency Medicine

## 2011-04-05 DIAGNOSIS — Y990 Civilian activity done for income or pay: Secondary | ICD-10-CM | POA: Insufficient documentation

## 2011-04-05 DIAGNOSIS — E785 Hyperlipidemia, unspecified: Secondary | ICD-10-CM | POA: Insufficient documentation

## 2011-04-05 DIAGNOSIS — I10 Essential (primary) hypertension: Secondary | ICD-10-CM | POA: Insufficient documentation

## 2011-04-05 DIAGNOSIS — W268XXA Contact with other sharp object(s), not elsewhere classified, initial encounter: Secondary | ICD-10-CM | POA: Insufficient documentation

## 2011-04-05 DIAGNOSIS — K219 Gastro-esophageal reflux disease without esophagitis: Secondary | ICD-10-CM | POA: Insufficient documentation

## 2011-04-05 DIAGNOSIS — Z79899 Other long term (current) drug therapy: Secondary | ICD-10-CM | POA: Insufficient documentation

## 2011-04-05 DIAGNOSIS — S61409A Unspecified open wound of unspecified hand, initial encounter: Secondary | ICD-10-CM | POA: Insufficient documentation

## 2011-04-05 DIAGNOSIS — I2510 Atherosclerotic heart disease of native coronary artery without angina pectoris: Secondary | ICD-10-CM | POA: Insufficient documentation

## 2011-05-02 ENCOUNTER — Ambulatory Visit: Payer: Self-pay | Admitting: Cardiology

## 2011-05-02 ENCOUNTER — Other Ambulatory Visit: Payer: Self-pay | Admitting: *Deleted

## 2011-05-03 ENCOUNTER — Other Ambulatory Visit: Payer: Self-pay | Admitting: *Deleted

## 2011-05-03 DIAGNOSIS — E119 Type 2 diabetes mellitus without complications: Secondary | ICD-10-CM

## 2011-05-03 DIAGNOSIS — E785 Hyperlipidemia, unspecified: Secondary | ICD-10-CM

## 2011-05-19 ENCOUNTER — Encounter: Payer: Self-pay | Admitting: Cardiology

## 2011-05-20 ENCOUNTER — Ambulatory Visit (INDEPENDENT_AMBULATORY_CARE_PROVIDER_SITE_OTHER): Payer: Self-pay | Admitting: Cardiology

## 2011-05-20 ENCOUNTER — Other Ambulatory Visit (INDEPENDENT_AMBULATORY_CARE_PROVIDER_SITE_OTHER): Payer: Managed Care, Other (non HMO) | Admitting: *Deleted

## 2011-05-20 ENCOUNTER — Encounter: Payer: Self-pay | Admitting: Cardiology

## 2011-05-20 VITALS — BP 128/90 | HR 70 | Ht 68.0 in | Wt 277.0 lb

## 2011-05-20 DIAGNOSIS — E785 Hyperlipidemia, unspecified: Secondary | ICD-10-CM

## 2011-05-20 DIAGNOSIS — E119 Type 2 diabetes mellitus without complications: Secondary | ICD-10-CM

## 2011-05-20 DIAGNOSIS — I2510 Atherosclerotic heart disease of native coronary artery without angina pectoris: Secondary | ICD-10-CM

## 2011-05-20 DIAGNOSIS — I10 Essential (primary) hypertension: Secondary | ICD-10-CM

## 2011-05-20 LAB — LIPID PANEL
LDL Cholesterol: 58 mg/dL (ref 0–99)
Total CHOL/HDL Ratio: 3
Triglycerides: 60 mg/dL (ref 0.0–149.0)

## 2011-05-20 LAB — BASIC METABOLIC PANEL
Chloride: 103 mEq/L (ref 96–112)
Creatinine, Ser: 0.7 mg/dL (ref 0.4–1.5)
Potassium: 3.9 mEq/L (ref 3.5–5.1)

## 2011-05-20 LAB — HEPATIC FUNCTION PANEL
ALT: 39 U/L (ref 0–53)
Bilirubin, Direct: 0 mg/dL (ref 0.0–0.3)
Total Bilirubin: 0.8 mg/dL (ref 0.3–1.2)

## 2011-05-20 NOTE — Patient Instructions (Signed)
Continue current medications.  We will call with the results of your lab work.  We will schedule you for a nuclear stress test.  I will see you again in 6 months.

## 2011-05-20 NOTE — Assessment & Plan Note (Signed)
I am very encouraged with his weight loss. Have encouraged him to continue with his dietary and lifestyle modification.

## 2011-05-20 NOTE — Assessment & Plan Note (Signed)
He remains asymptomatic at this time. It has been 3 years since his last stress test so we will set him up for a nuclear stress test.

## 2011-05-20 NOTE — Assessment & Plan Note (Signed)
He is currently on Lipitor, niacin, and fish oil. Will check fasting lab work today.

## 2011-05-20 NOTE — Assessment & Plan Note (Signed)
Blood pressure is well controlled on current medications. We will continue Toprol-XL.

## 2011-05-20 NOTE — Progress Notes (Signed)
   Tim Heath Date of Birth: 1961/08/20   History of Present Illness: Tim Heath is seen today for followup. He is doing much better with his diet. He is trying to lose weight slowly and has lost 17 pounds since his last visit 6 months ago. He denies any symptoms of chest pain, shortness of breath, palpitations. He has had no edema.  Current Outpatient Prescriptions on File Prior to Visit  Medication Sig Dispense Refill  . Acetaminophen (TYLENOL ARTHRITIS PAIN PO) Take by mouth 2 (two) times daily.        Marland Kitchen aspirin 81 MG tablet Take 81 mg by mouth daily.        Marland Kitchen atorvastatin (LIPITOR) 80 MG tablet Take 80 mg by mouth daily.        . clopidogrel (PLAVIX) 75 MG tablet Take 75 mg by mouth daily.        . fish oil-omega-3 fatty acids 1000 MG capsule Take 1 g by mouth daily.        . metoprolol (TOPROL-XL) 50 MG 24 hr tablet Take 50 mg by mouth daily.        . Multiple Vitamin (MULTIVITAMIN) tablet Take 1 tablet by mouth daily.        . niacin (NIASPAN) 1000 MG CR tablet Take 1,000 mg by mouth at bedtime.        Marland Kitchen omeprazole (PRILOSEC) 20 MG capsule Take 20 mg by mouth daily.        . Tamsulosin HCl (FLOMAX) 0.4 MG CAPS Take 0.4 mg by mouth daily.          No Known Allergies  Past Medical History  Diagnosis Date  . Hypertension   . Hyperlipidemia   . Coronary artery disease   . Morbid obesity   . History of Bell's palsy     Past Surgical History  Procedure Date  . Cardiac catheterization 04/12/2004    EF 55%  . Tonsillectomy   . Vasectomy     History  Smoking status  . Former Smoker  Smokeless tobacco  . Not on file    History  Alcohol Use No    Family History  Problem Relation Age of Onset  . Liver cancer Mother   . Emphysema Father   . Hypertension Brother   . Coronary artery disease Brother     Review of Systems: The review of systems is positive for recent laceration of the finger on his right hand.  All other systems were reviewed and are  negative.  Physical Exam: BP 128/90  Pulse 70  Ht 5\' 8"  (1.727 m)  Wt 277 lb (125.646 kg)  BMI 42.12 kg/m2 He is an obese white male in no acute distress. He is normocephalic, atraumatic. Pupils are equal round and reactive to light and accommodation. Extraocular movements are full. Oropharynx is clear. Neck is supple no JVD, adenopathy, thyromegaly, or bruits. Lungs are clear. Cardiac exam reveals a regular rate and rhythm without gallop, murmur, or click. Abdomen is obese, soft, nontender. He has no edema. Pedal pulses are good. Skin is warm and dry. He is alert and oriented x3. Cranial nerves II through XII are intact. LABORATORY DATA:   Assessment / Plan:

## 2011-05-26 ENCOUNTER — Telehealth: Payer: Self-pay | Admitting: *Deleted

## 2011-05-26 NOTE — Telephone Encounter (Signed)
Notified of lab results. Will send copy to Dr. Tiburcio Pea

## 2011-05-26 NOTE — Telephone Encounter (Signed)
Message copied by Lorayne Bender on Thu May 26, 2011 11:32 AM ------      Message from: [place], PETER M      Created: Fri May 20, 2011  5:20 PM       Chemistries, A1c, and lipids all look great!

## 2011-05-30 ENCOUNTER — Encounter: Payer: Self-pay | Admitting: *Deleted

## 2011-06-01 ENCOUNTER — Telehealth: Payer: Self-pay | Admitting: Cardiology

## 2011-06-01 NOTE — Telephone Encounter (Signed)
Faxed last EKG today to [phone] to [place] prior to pt appt on Monday 8/13

## 2011-06-01 NOTE — Telephone Encounter (Signed)
Tim Heath needs last EKG faxed by Monday 8/13 for his visit at [facility]

## 2011-06-06 ENCOUNTER — Ambulatory Visit (HOSPITAL_COMMUNITY): Payer: Managed Care, Other (non HMO) | Attending: Cardiology | Admitting: Radiology

## 2011-06-06 VITALS — Ht 68.0 in | Wt 276.0 lb

## 2011-06-06 DIAGNOSIS — I2510 Atherosclerotic heart disease of native coronary artery without angina pectoris: Secondary | ICD-10-CM | POA: Insufficient documentation

## 2011-06-06 DIAGNOSIS — R0609 Other forms of dyspnea: Secondary | ICD-10-CM

## 2011-06-06 MED ORDER — TECHNETIUM TC 99M TETROFOSMIN IV KIT
33.0000 | PACK | Freq: Once | INTRAVENOUS | Status: AC | PRN
  Administered 2011-06-06: 33 via INTRAVENOUS

## 2011-06-06 NOTE — Progress Notes (Signed)
[facility] SITE 3 NUCLEAR MED [address] [place] [address] [phone]  Cardiology Nuclear Med Study  Tim Heath is a 50 y.o. male [id number] 03/02/1961   Nuclear Med Background Indication for Stress Test:  Evaluation for Ischemia History: '05 Heart Catheterization: EF 55% patent stents, '09 Myocardial Perfusion Study: EF 62%  and 2003: RCA/2004 Stents:  Cardiac Risk Factors: Family History - CAD, History of Smoking, Hypertension and Lipids  Symptoms:  DOE and Fatigue   Nuclear Pre-Procedure Caffeine/Decaff Intake:  None NPO After: 10:00pm   Lungs:  clear IV 0.9% NS with Angio Cath:  20g  IV Site: R Wrist  IV Started by:  Stanton Kidney, EMT-P  Chest Size (in):  50 Cup Size: n/a  Height: 5\' 8"  (1.727 m)  Weight:  276 lb (125.193 kg)  BMI:  Body mass index is 41.97 kg/(m^2). Tech Comments:  Toprol held > 36 hours, per patient.    Nuclear Med Study 1 or 2 day study: 2 day  Stress Test Type:  Stress  Reading MD: Charlton Haws MD  Order Authorizing Provider:  P.Jordan  Resting Radionuclide: Technetium 27m Tetrofosmin  Resting Radionuclide Dose: 33 mCi   Stress Radionuclide:  Technetium 70m Tetrofosmin  Stress Radionuclide Dose: 33 mCi           Stress Protocol Rest HR: 73 Stress HR: 173  Rest BP: 131/78 Stress BP: 210/88  Exercise Time (min): 6:00 METS: 7.0   Predicted Max HR: 169 bpm % Max HR: 102.37 bpm Rate Pressure Product: 21308   Dose of Adenosine (mg):  n/a Dose of Lexiscan: n/a mg  Dose of Atropine (mg): n/a Dose of Dobutamine: n/a mcg/kg/min (at max HR)  Stress Test Technologist: Milana Na, EMT-P  Nuclear Technologist:  Domenic Polite, CNMT     Rest Procedure:  Myocardial perfusion imaging was performed at rest 45 minutes following the intravenous administration of Technetium 50m Tetrofosmin. Rest ECG: NSR  Stress Procedure:  The patient exercised for 6:00.  The patient stopped due to fatigue and denied any chest pain.   There were no significant ST-T wave changes.  Technetium 79m Tetrofosmin was injected at peak exercise and myocardial perfusion imaging was performed after a brief delay. Stress ECG: No significant change from baseline ECG  QPS Raw Data Images:  Normal; no motion artifact; normal heart/lung ratio. Stress Images:  Normal homogeneous uptake in all areas of the myocardium. Rest Images:  Normal homogeneous uptake in all areas of the myocardium. Subtraction (SDS):  Normal Transient Ischemic Dilatation (Normal <1.22): .77 Lung/Heart Ratio (Normal <0.45):  .35  Quantitative Gated Spect Images QGS EDV:  100 ml QGS ESV:  34 ml QGS cine images:  NL LV Function; NL Wall Motion QGS EF: 66%  Impression Exercise Capacity:  Fair exercise capacity. BP Response:  Hypertensive blood pressure response. Clinical Symptoms:  No chest pain. ECG Impression:  No significant ST segment change suggestive of ischemia. Comparison with Prior Nuclear Study: No images to compare  Overall Impression:  Normal stress nuclear study.    Charlton Haws

## 2011-06-07 ENCOUNTER — Ambulatory Visit (HOSPITAL_COMMUNITY): Payer: Managed Care, Other (non HMO) | Attending: Cardiology | Admitting: Radiology

## 2011-06-07 DIAGNOSIS — R0989 Other specified symptoms and signs involving the circulatory and respiratory systems: Secondary | ICD-10-CM

## 2011-06-07 MED ORDER — TECHNETIUM TC 99M TETROFOSMIN IV KIT
33.0000 | PACK | Freq: Once | INTRAVENOUS | Status: AC | PRN
  Administered 2011-06-07: 33 via INTRAVENOUS

## 2011-06-08 NOTE — Progress Notes (Signed)
nuc med report routed to Dr.Jordan 06/08/11 [place]  

## 2011-06-09 ENCOUNTER — Telehealth: Payer: Self-pay | Admitting: Cardiology

## 2011-06-09 NOTE — Telephone Encounter (Signed)
Received call from patient asking for Stress test results.  Pt had st on 06/06/11/06/07/11.  Advised Dr. Swaziland out and test will have to be reviewed by him.  Advised may not hear until 06/15/2011.  This is FYI.

## 2011-06-09 NOTE — Telephone Encounter (Signed)
Noted. Will call him when Dr. Swaziland reviews test,

## 2011-06-12 NOTE — Progress Notes (Signed)
Normal nuclear stress test. EF is normal. Keep working on losing weight and regular exercise. Tim Heath

## 2011-06-13 NOTE — Progress Notes (Signed)
Notified of stress test results. Will send copy to Dr. Holley Bouche

## 2011-07-27 LAB — TROPONIN I: Troponin I: <0.01

## 2011-07-27 LAB — CBC
MCHC: 33.8
RBC: 4.72
RDW: 13.2

## 2011-07-27 LAB — DIFFERENTIAL
Basophils Absolute: 0
Basophils Relative: 1
Monocytes Relative: 9
Neutro Abs: 3.1
Neutrophils Relative %: 51

## 2011-07-27 LAB — POCT CARDIAC MARKERS
CKMB, poc: <1 — ABNORMAL LOW
Myoglobin, poc: 58.1
Troponin i, poc: <0.05

## 2011-07-27 LAB — CARDIAC PANEL(CRET KIN+CKTOT+MB+TROPI)
CK, MB: 1
Relative Index: 0.9
Troponin I: <0.01

## 2011-07-27 LAB — POCT I-STAT, CHEM 8
HCT: 45
Hemoglobin: 15.3
Potassium: 4
Sodium: 141

## 2011-07-27 LAB — D-DIMER, QUANTITATIVE: D-Dimer, Quant: 0.31

## 2011-12-06 ENCOUNTER — Ambulatory Visit: Payer: Managed Care, Other (non HMO) | Admitting: Cardiology

## 2012-01-23 ENCOUNTER — Telehealth: Payer: Self-pay | Admitting: Cardiology

## 2012-01-24 ENCOUNTER — Ambulatory Visit: Payer: Managed Care, Other (non HMO) | Admitting: Cardiology

## 2012-01-24 NOTE — Telephone Encounter (Signed)
Error

## 2012-02-08 ENCOUNTER — Other Ambulatory Visit: Payer: Self-pay

## 2012-02-08 MED ORDER — NIACIN ER (ANTIHYPERLIPIDEMIC) 1000 MG PO TBCR: 1000.0000 mg | EXTENDED_RELEASE_TABLET | Freq: Every day | ORAL | Status: DC

## 2012-02-08 MED ORDER — ATORVASTATIN CALCIUM 80 MG PO TABS: 80.0000 mg | ORAL_TABLET | Freq: Every day | ORAL | Status: DC

## 2012-02-09 ENCOUNTER — Other Ambulatory Visit: Payer: Self-pay | Admitting: *Deleted

## 2012-02-09 MED ORDER — ATORVASTATIN CALCIUM 80 MG PO TABS: 80.0000 mg | ORAL_TABLET | Freq: Every day | ORAL | Status: DC

## 2012-02-09 NOTE — Telephone Encounter (Signed)
Refilled atorvastatin

## 2012-02-10 ENCOUNTER — Other Ambulatory Visit: Payer: Self-pay | Admitting: *Deleted

## 2012-02-13 ENCOUNTER — Other Ambulatory Visit: Payer: Self-pay | Admitting: Cardiology

## 2012-02-13 MED ORDER — ATORVASTATIN CALCIUM 80 MG PO TABS: 80.0000 mg | ORAL_TABLET | Freq: Every day | ORAL | Status: DC

## 2012-02-13 MED ORDER — METOPROLOL SUCCINATE ER 50 MG PO TB24: 50.0000 mg | ORAL_TABLET | Freq: Every day | ORAL | Status: DC

## 2012-02-14 ENCOUNTER — Other Ambulatory Visit: Payer: Self-pay

## 2012-02-14 ENCOUNTER — Other Ambulatory Visit: Payer: Self-pay | Admitting: *Deleted

## 2012-02-14 MED ORDER — CLOPIDOGREL BISULFATE 75 MG PO TABS: 75.0000 mg | ORAL_TABLET | Freq: Every day | ORAL | Status: DC

## 2012-02-14 MED ORDER — NIACIN ER (ANTIHYPERLIPIDEMIC) 1000 MG PO TBCR: 1000.0000 mg | EXTENDED_RELEASE_TABLET | Freq: Every day | ORAL | Status: DC

## 2012-03-22 ENCOUNTER — Encounter: Payer: Self-pay | Admitting: Cardiology

## 2012-03-22 ENCOUNTER — Ambulatory Visit (INDEPENDENT_AMBULATORY_CARE_PROVIDER_SITE_OTHER): Payer: Managed Care, Other (non HMO) | Admitting: Cardiology

## 2012-03-22 ENCOUNTER — Other Ambulatory Visit: Payer: Self-pay

## 2012-03-22 VITALS — BP 120/76 | HR 64 | Ht 68.0 in | Wt 292.4 lb

## 2012-03-22 DIAGNOSIS — E785 Hyperlipidemia, unspecified: Secondary | ICD-10-CM

## 2012-03-22 DIAGNOSIS — I10 Essential (primary) hypertension: Secondary | ICD-10-CM

## 2012-03-22 DIAGNOSIS — I2510 Atherosclerotic heart disease of native coronary artery without angina pectoris: Secondary | ICD-10-CM

## 2012-03-22 NOTE — Assessment & Plan Note (Signed)
Remote stenting of the right coronary and intermediate branch. He remains asymptomatic. Stress Myoview study in August of 2012 was normal. Continue current therapy.

## 2012-03-22 NOTE — Assessment & Plan Note (Signed)
Blood pressure is under excellent control. 

## 2012-03-22 NOTE — Assessment & Plan Note (Signed)
Lab work reviewed from his last visit. Continue current lipid-lowering therapy. We will recheck fasting lab work in 6 months.

## 2012-03-22 NOTE — Assessment & Plan Note (Signed)
Discussed importance of weight loss. No symptoms of sleep apnea.

## 2012-03-22 NOTE — Progress Notes (Signed)
   Tim Heath Date of Birth: January 07, 1961   History of Present Illness: Tim Heath is seen today for followup. He is doing well from a cardiac standpoint. He denies any chest pain or shortness of breath. He's working very hard. He has gained 15 pounds since his last visit. He denies any difficulty sleeping or snoring. His wife actually uses CPAP therapy but he states he's never had any problems.  Current Outpatient Prescriptions on File Prior to Visit  Medication Sig Dispense Refill  . Acetaminophen (TYLENOL ARTHRITIS PAIN PO) Take by mouth 2 (two) times daily.        Marland Kitchen aspirin 81 MG tablet Take 81 mg by mouth daily.        Marland Kitchen atorvastatin (LIPITOR) 80 MG tablet Take 1 tablet (80 mg total) by mouth daily.  90 tablet  0  . clopidogrel (PLAVIX) 75 MG tablet Take 1 tablet (75 mg total) by mouth daily.  90 tablet  3  . fish oil-omega-3 fatty acids 1000 MG capsule Take 1 g by mouth daily.        . metoprolol succinate (TOPROL-XL) 50 MG 24 hr tablet Take 1 tablet (50 mg total) by mouth daily.  90 tablet  0  . Multiple Vitamin (MULTIVITAMIN) tablet Take 1 tablet by mouth daily.        . niacin (NIASPAN) 1000 MG CR tablet Take 1 tablet (1,000 mg total) by mouth at bedtime.  90 tablet  1  . omeprazole (PRILOSEC) 20 MG capsule Take 20 mg by mouth daily.        . Tamsulosin HCl (FLOMAX) 0.4 MG CAPS Take 0.4 mg by mouth daily.          No Known Allergies  Past Medical History  Diagnosis Date  . Hypertension   . Hyperlipidemia   . Coronary artery disease   . Morbid obesity   . History of Bell's palsy     Past Surgical History  Procedure Date  . Cardiac catheterization 04/12/2004    EF 55%  . Tonsillectomy   . Vasectomy     History  Smoking status  . Former Smoker  Smokeless tobacco  . Not on file    History  Alcohol Use No    Family History  Problem Relation Age of Onset  . Liver cancer Mother   . Emphysema Father   . Hypertension Brother   . Coronary artery disease Brother      Review of Systems: As noted in history of present illness.  All other systems were reviewed and are negative.  Physical Exam: BP 120/76  Pulse 64  Ht 5\' 8"  (1.727 m)  Wt 292 lb 6.4 oz (132.632 kg)  BMI 44.46 kg/m2 He is an obese white male in no acute distress. He is normocephalic, atraumatic. Pupils are equal round and reactive to light and accommodation. Extraocular movements are full. Oropharynx is clear. Neck is supple no JVD, adenopathy, thyromegaly, or bruits. Lungs are clear. Cardiac exam reveals a regular rate and rhythm without gallop, murmur, or click. Abdomen is obese, soft, nontender. He has no edema. Pedal pulses are good. Skin is warm and dry. He is alert and oriented x3. Cranial nerves II through XII are intact. LABORATORY DATA: ECG today is normal. His last nuclear stress test in August 2012 was normal. Complete laboratory data at that time was also normal with his lipids been under excellent control and an A1c of 6%  Assessment / Plan:

## 2012-03-22 NOTE — Patient Instructions (Signed)
Continue your current therapy  I will see you again in 6 months with fasting lab work   

## 2012-05-11 ENCOUNTER — Other Ambulatory Visit: Payer: Self-pay | Admitting: *Deleted

## 2012-05-11 MED ORDER — METOPROLOL SUCCINATE ER 50 MG PO TB24: 50.0000 mg | ORAL_TABLET | Freq: Every day | ORAL | Status: DC

## 2012-08-14 ENCOUNTER — Other Ambulatory Visit: Payer: Self-pay

## 2012-08-14 ENCOUNTER — Other Ambulatory Visit: Payer: Self-pay | Admitting: Cardiology

## 2012-08-14 MED ORDER — OMEPRAZOLE 20 MG PO CPDR: 20.0000 mg | DELAYED_RELEASE_CAPSULE | Freq: Every day | ORAL | Status: DC

## 2012-08-14 MED ORDER — ATORVASTATIN CALCIUM 80 MG PO TABS: 80.0000 mg | ORAL_TABLET | Freq: Every day | ORAL | Status: DC

## 2012-08-14 MED ORDER — METOPROLOL SUCCINATE ER 50 MG PO TB24: 50.0000 mg | ORAL_TABLET | Freq: Every day | ORAL | Status: DC

## 2012-08-14 MED ORDER — CLOPIDOGREL BISULFATE 75 MG PO TABS: 75.0000 mg | ORAL_TABLET | Freq: Every day | ORAL | Status: DC

## 2012-08-14 MED ORDER — TAMSULOSIN HCL 0.4 MG PO CAPS: 0.4000 mg | ORAL_CAPSULE | Freq: Every day | ORAL | Status: DC

## 2012-08-14 MED ORDER — NIACIN ER (ANTIHYPERLIPIDEMIC) 1000 MG PO TBCR: 1000.0000 mg | EXTENDED_RELEASE_TABLET | Freq: Every day | ORAL | Status: DC

## 2012-08-14 MED ORDER — OMEGA-3 FATTY ACIDS 1000 MG PO CAPS: 1.0000 g | ORAL_CAPSULE | Freq: Every day | ORAL | Status: DC

## 2012-08-20 ENCOUNTER — Other Ambulatory Visit: Payer: Self-pay | Admitting: Cardiology

## 2012-08-20 MED ORDER — METOPROLOL SUCCINATE ER 50 MG PO TB24: 50.0000 mg | ORAL_TABLET | Freq: Every day | ORAL | Status: DC

## 2012-08-20 MED ORDER — NIACIN ER (ANTIHYPERLIPIDEMIC) 1000 MG PO TBCR: 1000.0000 mg | EXTENDED_RELEASE_TABLET | Freq: Every day | ORAL | Status: DC

## 2012-08-20 MED ORDER — CLOPIDOGREL BISULFATE 75 MG PO TABS: 75.0000 mg | ORAL_TABLET | Freq: Every day | ORAL | Status: DC

## 2012-08-20 MED ORDER — ATORVASTATIN CALCIUM 80 MG PO TABS: 80.0000 mg | ORAL_TABLET | Freq: Every day | ORAL | Status: DC

## 2012-08-20 MED ORDER — TAMSULOSIN HCL 0.4 MG PO CAPS: 0.4000 mg | ORAL_CAPSULE | Freq: Every day | ORAL | Status: DC

## 2012-08-20 NOTE — Telephone Encounter (Signed)
New problem:  [facility] on [address] -30 days supply .  Or do the office have any samples

## 2012-08-20 NOTE — Telephone Encounter (Signed)
.  the patient has a recall letter in the appointments to be seen by 09/18/2012. Return call to home no answer to LVM. Pt need to be schedule for appointment for future refills.

## 2012-11-16 ENCOUNTER — Other Ambulatory Visit: Payer: Self-pay | Admitting: *Deleted

## 2012-11-16 MED ORDER — METOPROLOL SUCCINATE ER 50 MG PO TB24: 50.0000 mg | ORAL_TABLET | Freq: Every day | ORAL | Status: DC

## 2013-01-14 ENCOUNTER — Encounter: Payer: Self-pay | Admitting: Cardiology

## 2013-01-16 ENCOUNTER — Telehealth: Payer: Self-pay | Admitting: Cardiology

## 2013-01-16 NOTE — Telephone Encounter (Signed)
Returned call to patient he stated he has been having a dull pain in left hand radiates up into left elbow and shoulder.States this pain started today off and on.States this morning pain lasted appox 1 hr.No pain at present.No chest pain.States just don't feel good.States this pain is similar to the pain he had when he had stents.Also complains of drooling for 2 weeks.No numbness in face.Appointment scheduled with Dr.Jordan tomorrow 01/17/13 at 9:00 am.Patient stated he is at work now but don't feel like working,has a stressful job.Advised to go home and rest, go to ER if needed.

## 2013-01-16 NOTE — Telephone Encounter (Signed)
New problem   Pt is experience numbness left arm and drooling.Pt is at work and pt's wife is calling for pt. Please call pt's wife concerning this matter

## 2013-01-17 ENCOUNTER — Other Ambulatory Visit: Payer: Self-pay | Admitting: Cardiology

## 2013-01-17 ENCOUNTER — Encounter: Payer: Self-pay | Admitting: Cardiology

## 2013-01-17 ENCOUNTER — Ambulatory Visit
Admission: RE | Admit: 2013-01-17 | Discharge: 2013-01-17 | Disposition: A | Discharge status: Home / Self Care | Payer: Managed Care, Other (non HMO) | Source: Ambulatory Visit | Attending: Cardiology | Admitting: Cardiology

## 2013-01-17 ENCOUNTER — Ambulatory Visit (INDEPENDENT_AMBULATORY_CARE_PROVIDER_SITE_OTHER): Payer: Managed Care, Other (non HMO) | Admitting: Cardiology

## 2013-01-17 VITALS — BP 126/80 | HR 70 | Ht 68.0 in | Wt 295.8 lb

## 2013-01-17 DIAGNOSIS — I2510 Atherosclerotic heart disease of native coronary artery without angina pectoris: Secondary | ICD-10-CM

## 2013-01-17 DIAGNOSIS — E785 Hyperlipidemia, unspecified: Secondary | ICD-10-CM

## 2013-01-17 DIAGNOSIS — I10 Essential (primary) hypertension: Secondary | ICD-10-CM

## 2013-01-17 DIAGNOSIS — I200 Unstable angina: Secondary | ICD-10-CM

## 2013-01-17 LAB — BASIC METABOLIC PANEL
CO2: 28 mEq/L (ref 19–32)
Chloride: 102 mEq/L (ref 96–112)
Creatinine, Ser: 0.7 mg/dL (ref 0.4–1.5)

## 2013-01-17 LAB — CBC WITH DIFFERENTIAL/PLATELET
Basophils Relative: 0.1 % (ref 0.0–3.0)
Eosinophils Absolute: 0.2 10*3/uL (ref 0.0–0.7)
Hemoglobin: 16.2 g/dL (ref 13.0–17.0)
MCHC: 33.4 g/dL (ref 30.0–36.0)
MCV: 96.9 fl (ref 78.0–100.0)
Monocytes Absolute: 0.6 10*3/uL (ref 0.1–1.0)
Neutro Abs: 3.9 10*3/uL (ref 1.4–7.7)
Neutrophils Relative %: 55.6 % (ref 43.0–77.0)
RBC: 5.01 Mil/uL (ref 4.22–5.81)

## 2013-01-17 MED ORDER — CLOPIDOGREL BISULFATE 75 MG PO TABS: 75.0000 mg | ORAL_TABLET | Freq: Every day | ORAL | Status: DC

## 2013-01-17 MED ORDER — NIACIN ER (ANTIHYPERLIPIDEMIC) 1000 MG PO TBCR: 1000.0000 mg | EXTENDED_RELEASE_TABLET | Freq: Every day | ORAL | Status: DC

## 2013-01-17 MED ORDER — TAMSULOSIN HCL 0.4 MG PO CAPS: 0.4000 mg | ORAL_CAPSULE | Freq: Every day | ORAL | Status: DC

## 2013-01-17 MED ORDER — NITROGLYCERIN 0.4 MG SL SUBL: 0.4000 mg | SUBLINGUAL_TABLET | SUBLINGUAL | Status: DC | PRN

## 2013-01-17 MED ORDER — METOPROLOL SUCCINATE ER 50 MG PO TB24: 50.0000 mg | ORAL_TABLET | Freq: Every day | ORAL | Status: DC

## 2013-01-17 MED ORDER — OMEPRAZOLE 20 MG PO CPDR: 20.0000 mg | DELAYED_RELEASE_CAPSULE | Freq: Every day | ORAL | Status: DC

## 2013-01-17 MED ORDER — ATORVASTATIN CALCIUM 80 MG PO TABS: 80.0000 mg | ORAL_TABLET | Freq: Every day | ORAL | Status: DC

## 2013-01-17 NOTE — Progress Notes (Signed)
 Tim Heath Date of Birth: 10/31/1960   History of Present Illness: Tim is seen today as a work in for evaluation of chest pain. He has a history of coronary disease with stenting of the right coronary in 2003 with a Cypher stent. He had stenting of the ramus intermediate branch in 2004 with a Taxus stent. Cardiac catheterization 2005 showed nonobstructive disease. His last Myoview study in August of 2012 was normal. Yesterday while at work he complained of pain in his left hand radiating to his elbow and shoulder. This is described as a dull pain. It lasted one hour and then eased off. He did not take any nitroglycerin. The symptoms are similar to his prior anginal symptoms. Prior to this he's had a couple of episodes where he has both chest pain and left arm pain associated with stress and activity. 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 Another day he states he just felt really bad.  Current Outpatient Prescriptions on File Prior to Visit  Medication Sig Dispense Refill  . Acetaminophen (TYLENOL ARTHRITIS PAIN PO) Take by mouth 2 (two) times daily.        . aspirin 81 MG tablet Take 81 mg by mouth daily.        . fish oil-omega-3 fatty acids 1000 MG capsule Take 1 capsule (1 g total) by mouth daily.  90 capsule  1  . Multiple Vitamin (MULTIVITAMIN) tablet Take 1 tablet by mouth daily.         No current facility-administered medications on file prior to visit.    No Known Allergies  Past Medical History  Diagnosis Date  . Hypertension   . Hyperlipidemia   . Coronary artery disease     DES RCA 2003, DES intermediate 2004  . Morbid obesity   . History of Bell's palsy     Past Surgical History  Procedure Laterality Date  . Cardiac catheterization  04/12/2004    EF 55%  . Tonsillectomy    . Vasectomy      History  Smoking status  . Former Smoker  Smokeless tobacco  . Not on file    History  Alcohol Use No    Family History  Problem  Relation Age of Onset  . Liver cancer Mother   . Emphysema Father   . Hypertension Brother   . Coronary artery disease Brother     Review of Systems: As noted in history of present illness. He does note that he has some drooling from the left side of his mouth. He has a history of Bell's palsy in the remote past. All other systems were reviewed and are negative.  Physical Exam: BP 126/80  Pulse 70  Ht 5' 8" (1.727 m)  Wt 295 lb 12.8 oz (134.174 kg)  BMI 44.99 kg/m2 He is an obese white male in no acute distress. He is normocephalic, atraumatic. Pupils are equal round and reactive to light and accommodation. Extraocular movements are full. Oropharynx is clear. Neck is supple no JVD, adenopathy, thyromegaly, or bruits. Lungs are clear. Cardiac exam reveals a regular rate and rhythm without gallop, murmur, or click. Abdomen is obese, soft, nontender. He has no edema. Pedal pulses are good. Skin is warm and dry. He is alert and oriented x3. Cranial nerves II through XII are intact. There is no facial droop. LABORATORY DATA: ECG today is normal. Rate is 70 beats per minute.  Assessment / Plan: 1. Crescendo angina. It has been   10 years since his last cardiac catheterization. He had significant obstructive disease at a very young age. His work is very physically demanding. For these reasons I recommended proceeding directly to cardiac catheterization to define his coronary anatomy. He will be scheduled for this study tomorrow. We will check baseline lab work today and chest x-ray. He will continue with his current medications. 2. Morbid obesity. 3. Hyperlipidemia. He had recent lab work done by his primary care. We will obtain a copy. Continue high-dose atorvastatin and niacin. 4. Hypertension-continue metoprolol 

## 2013-01-18 ENCOUNTER — Encounter (HOSPITAL_BASED_OUTPATIENT_CLINIC_OR_DEPARTMENT_OTHER): Payer: Self-pay

## 2013-01-18 ENCOUNTER — Encounter (HOSPITAL_BASED_OUTPATIENT_CLINIC_OR_DEPARTMENT_OTHER)
Admission: RE | Disposition: A | Discharge status: Home / Self Care | Payer: Self-pay | Source: Ambulatory Visit | Attending: Cardiology

## 2013-01-18 ENCOUNTER — Inpatient Hospital Stay (HOSPITAL_BASED_OUTPATIENT_CLINIC_OR_DEPARTMENT_OTHER)
Admission: RE | Admit: 2013-01-18 | Discharge: 2013-01-18 | Disposition: A | Discharge status: Home / Self Care | Payer: Managed Care, Other (non HMO) | Source: Ambulatory Visit | Attending: Cardiology | Admitting: Cardiology

## 2013-01-18 DIAGNOSIS — I200 Unstable angina: Secondary | ICD-10-CM

## 2013-01-18 DIAGNOSIS — Z9861 Coronary angioplasty status: Secondary | ICD-10-CM | POA: Insufficient documentation

## 2013-01-18 DIAGNOSIS — I2510 Atherosclerotic heart disease of native coronary artery without angina pectoris: Secondary | ICD-10-CM | POA: Insufficient documentation

## 2013-01-18 DIAGNOSIS — E785 Hyperlipidemia, unspecified: Secondary | ICD-10-CM | POA: Insufficient documentation

## 2013-01-18 DIAGNOSIS — I10 Essential (primary) hypertension: Secondary | ICD-10-CM | POA: Insufficient documentation

## 2013-01-18 DIAGNOSIS — R079 Chest pain, unspecified: Secondary | ICD-10-CM | POA: Insufficient documentation

## 2013-01-18 SURGERY — JV LEFT HEART CATHETERIZATION WITH CORONARY ANGIOGRAM
Anesthesia: Moderate Sedation

## 2013-01-18 MED ORDER — SODIUM CHLORIDE 0.9 % IV SOLN
INTRAVENOUS | Status: DC
  Administered 2013-01-18: 11:00:00 via INTRAVENOUS

## 2013-01-18 MED ORDER — SODIUM CHLORIDE 0.9 % IJ SOLN: 3.0000 mL | Freq: Two times a day (BID) | INTRAMUSCULAR | Status: DC

## 2013-01-18 MED ORDER — ONDANSETRON HCL 4 MG/2ML IJ SOLN: 4.0000 mg | Freq: Four times a day (QID) | INTRAMUSCULAR | Status: DC | PRN

## 2013-01-18 MED ORDER — SODIUM CHLORIDE 0.9 % IJ SOLN: 3.0000 mL | INTRAMUSCULAR | Status: DC | PRN

## 2013-01-18 MED ORDER — SODIUM CHLORIDE 0.9 % IV SOLN: 1.0000 mL/kg/h | INTRAVENOUS | Status: DC

## 2013-01-18 MED ORDER — SODIUM CHLORIDE 0.9 % IV SOLN: 250.0000 mL | INTRAVENOUS | Status: DC | PRN

## 2013-01-18 MED ORDER — ACETAMINOPHEN 325 MG PO TABS: 650.0000 mg | ORAL_TABLET | ORAL | Status: DC | PRN

## 2013-01-18 MED ORDER — ASPIRIN 81 MG PO CHEW
324.0000 mg | CHEWABLE_TABLET | ORAL | Status: AC
  Administered 2013-01-18: 324 mg via ORAL

## 2013-01-18 NOTE — Interval H&P Note (Signed)
History and Physical Interval Note:  01/18/2013 11:38 AM  Tim Heath  has presented today for surgery, with the diagnosis of chest pain  The various methods of treatment have been discussed with the patient and family. After consideration of risks, benefits and other options for treatment, the patient has consented to  Procedure(s): JV LEFT HEART CATHETERIZATION WITH CORONARY ANGIOGRAM (N/A) as a surgical intervention .  The patient's history has been reviewed, patient examined, no change in status, stable for surgery.  I have reviewed the patient's chart and labs.  Questions were answered to the patient's satisfaction.     Theron Arista [facility] 01/18/2013 11:38 AM

## 2013-01-18 NOTE — Progress Notes (Signed)
Bedrest begins @ 1220, tegaderm dressing applied to right groin site by Army Melia, site level 0.

## 2013-01-18 NOTE — H&P (View-Only) (Signed)
Tim Heath Date of Birth: 15-May-1961   History of Present Illness: Tim Heath is seen today as a work in for evaluation of chest pain. He has a history of coronary disease with stenting of the right coronary in 2003 with a Cypher stent. He had stenting of the ramus intermediate branch in 2004 with a Taxus stent. Cardiac catheterization 2005 showed nonobstructive disease. His last Myoview study in August of 2012 was normal. Yesterday while at work he complained of pain in his left hand radiating to his elbow and shoulder. This is described as a dull pain. It lasted one hour and then eased off. He did not take any nitroglycerin. The symptoms are similar to his prior anginal symptoms. Prior to this he's had a couple of episodes where he has both chest pain and left arm pain associated with stress and activity. 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 Another day he states he just felt really bad.  Current Outpatient Prescriptions on File Prior to Visit  Medication Sig Dispense Refill  . Acetaminophen (TYLENOL ARTHRITIS PAIN PO) Take by mouth 2 (two) times daily.        Marland Kitchen aspirin 81 MG tablet Take 81 mg by mouth daily.        . fish oil-omega-3 fatty acids 1000 MG capsule Take 1 capsule (1 g total) by mouth daily.  90 capsule  1  . Multiple Vitamin (MULTIVITAMIN) tablet Take 1 tablet by mouth daily.         No current facility-administered medications on file prior to visit.    No Known Allergies  Past Medical History  Diagnosis Date  . Hypertension   . Hyperlipidemia   . Coronary artery disease     DES RCA 2003, DES intermediate 2004  . Morbid obesity   . History of Bell's palsy     Past Surgical History  Procedure Laterality Date  . Cardiac catheterization  04/12/2004    EF 55%  . Tonsillectomy    . Vasectomy      History  Smoking status  . Former Smoker  Smokeless tobacco  . Not on file    History  Alcohol Use No    Family History  Problem  Relation Age of Onset  . Liver cancer Mother   . Emphysema Father   . Hypertension Brother   . Coronary artery disease Brother     Review of Systems: As noted in history of present illness. He does note that he has some drooling from the left side of his mouth. He has a history of Bell's palsy in the remote past. All other systems were reviewed and are negative.  Physical Exam: BP 126/80  Pulse 70  Ht 5\' 8"  (1.727 m)  Wt 295 lb 12.8 oz (134.174 kg)  BMI 44.99 kg/m2 He is an obese white male in no acute distress. He is normocephalic, atraumatic. Pupils are equal round and reactive to light and accommodation. Extraocular movements are full. Oropharynx is clear. Neck is supple no JVD, adenopathy, thyromegaly, or bruits. Lungs are clear. Cardiac exam reveals a regular rate and rhythm without gallop, murmur, or click. Abdomen is obese, soft, nontender. He has no edema. Pedal pulses are good. Skin is warm and dry. He is alert and oriented x3. Cranial nerves II through XII are intact. There is no facial droop. LABORATORY DATA: ECG today is normal. Rate is 70 beats per minute.  Assessment / Plan: 1. Crescendo angina. It has been  10 years since his last cardiac catheterization. He had significant obstructive disease at a very young age. His work is very physically demanding. For these reasons I recommended proceeding directly to cardiac catheterization to define his coronary anatomy. He will be scheduled for this study tomorrow. We will check baseline lab work today and chest x-ray. He will continue with his current medications. 2. Morbid obesity. 3. Hyperlipidemia. He had recent lab work done by his primary care. We will obtain a copy. Continue high-dose atorvastatin and niacin. 4. Hypertension-continue metoprolol

## 2013-01-18 NOTE — Progress Notes (Signed)
Became slightly dizzy and hypotensive, placed back in bed, IV fluids increased and blood pressure back up to 119/77.

## 2013-01-18 NOTE — CV Procedure (Signed)
   Cardiac Catheterization Procedure Note  Name: ZAYVEN POWE MRN: [id number] DOB: 11-23-1960  Procedure: Left Heart Cath, Selective Coronary Angiography, LV angiography  Indication: 52 year old white male with history of coronary artery disease status post stenting of the right coronary in 2003 and the ramus intermediate branch and 2004.  He presents with left arm pain similar to his prior anginal pain.   Procedural details: The right groin was prepped, draped, and anesthetized with 1% lidocaine. Using modified Seldinger technique, a 5 French sheath was introduced into the right femoral artery. Standard Judkins catheters were used for coronary angiography and left ventriculography. Catheter exchanges were performed over a guidewire. There were no immediate procedural complications. The patient was transferred to the post catheterization recovery area for further monitoring.  Procedural Findings: Hemodynamics:  AO 129/72 with a mean of 97 mmHg LV 134/22 mmHg   Coronary angiography: Coronary dominance: right  Left mainstem: Mildly calcified.  No significant disease.  Left anterior descending (LAD): There is a 50% stenosis at the takeoff of the first septal perforator.  The remainder of the LAD and diagonals were without significant disease.  Ramus intermediate branch is widely patent at the prior stent site.  Left circumflex (LCx): Left circumflex has only mild irregularities.  Right coronary artery (RCA): The right coronary is a large dominant vessel.  In the mid vessel there is a focal area of 30% in-stent disease.  There diffuse irregularities in the distal RCA.  Left ventriculography: Left ventricular systolic function is normal, LVEF is estimated at 55-65%, there is no significant mitral regurgitation   Final Conclusions:   1.  Nonobstructive coronary disease.  Continued patency of the stents in the ramus intermediate branch and RCA. 2.  Normal LV  function.  Recommendations: Continue medical management.  Theron Arista [facility] 01/18/2013, 12:10 PM

## 2013-01-18 NOTE — Progress Notes (Signed)
Allen's test performed on right hand  Which was negative.

## 2013-02-05 ENCOUNTER — Encounter: Payer: Self-pay | Admitting: Cardiology

## 2013-02-08 ENCOUNTER — Encounter: Payer: Managed Care, Other (non HMO) | Admitting: Nurse Practitioner

## 2013-03-11 ENCOUNTER — Encounter: Payer: Managed Care, Other (non HMO) | Admitting: Nurse Practitioner

## 2013-07-08 ENCOUNTER — Encounter: Payer: Self-pay | Admitting: Cardiology

## 2013-07-08 ENCOUNTER — Ambulatory Visit (INDEPENDENT_AMBULATORY_CARE_PROVIDER_SITE_OTHER): Payer: Managed Care, Other (non HMO) | Admitting: Cardiology

## 2013-07-08 VITALS — BP 150/90 | HR 69 | Ht 68.0 in | Wt 299.0 lb

## 2013-07-08 DIAGNOSIS — E785 Hyperlipidemia, unspecified: Secondary | ICD-10-CM

## 2013-07-08 DIAGNOSIS — I10 Essential (primary) hypertension: Secondary | ICD-10-CM

## 2013-07-08 DIAGNOSIS — I2510 Atherosclerotic heart disease of native coronary artery without angina pectoris: Secondary | ICD-10-CM

## 2013-07-08 NOTE — Progress Notes (Signed)
[name] Date of Birth: 1961-05-01   History of Present Illness: Tim Heath is seen today for followup. He has a history of coronary disease with stenting of the right coronary in 2003 with a Cypher stent. He had stenting of the ramus intermediate branch in 2004 with a Taxus stent. Cardiac catheterization in March 2014 showed nonobstructive disease. He states he has done very well. He denies any chest pain or shortness of breath. He is still very active and has a very strenuous job.  Current Outpatient Prescriptions on File Prior to Visit  Medication Sig Dispense Refill  . Acetaminophen (TYLENOL ARTHRITIS PAIN PO) Take by mouth 2 (two) times daily.        Marland Kitchen aspirin 81 MG tablet Take 81 mg by mouth daily.        Marland Kitchen atorvastatin (LIPITOR) 80 MG tablet Take 1 tablet (80 mg total) by mouth daily.  90 tablet  3  . clopidogrel (PLAVIX) 75 MG tablet Take 1 tablet (75 mg total) by mouth daily.  90 tablet  3  . fish oil-omega-3 fatty acids 1000 MG capsule Take 1 capsule (1 g total) by mouth daily.  90 capsule  1  . metoprolol succinate (TOPROL-XL) 50 MG 24 hr tablet Take 1 tablet (50 mg total) by mouth daily.  90 tablet  3  . Multiple Vitamin (MULTIVITAMIN) tablet Take 1 tablet by mouth daily.        . nitroGLYCERIN (NITROSTAT) 0.4 MG SL tablet Place 1 tablet (0.4 mg total) under the tongue every 5 (five) minutes as needed for chest pain.  100 tablet  6  . omeprazole (PRILOSEC) 20 MG capsule Take 1 capsule (20 mg total) by mouth daily.  90 capsule  3  . tamsulosin (FLOMAX) 0.4 MG CAPS Take 1 capsule (0.4 mg total) by mouth daily.  90 capsule  3   No current facility-administered medications on file prior to visit.    No Known Allergies  Past Medical History  Diagnosis Date  . Hypertension   . Hyperlipidemia   . Coronary artery disease     DES RCA 2003, DES intermediate 2004  . Morbid obesity   . History of Bell's palsy     Past Surgical History  Procedure Laterality Date  . Cardiac  catheterization  04/12/2004    EF 55%  . Tonsillectomy    . Vasectomy      History  Smoking status  . Former Smoker  Smokeless tobacco  . Not on file    History  Alcohol Use No    Family History  Problem Relation Age of Onset  . Liver cancer Mother   . Emphysema Father   . Hypertension Brother   . Coronary artery disease Brother     Review of Systems: As noted in history of present illness. All other systems were reviewed and are negative.  Physical Exam: BP 150/90  Pulse 69  Ht 5\' 8"  (1.727 m)  Wt 299 lb (135.626 kg)  BMI 45.47 kg/m2  SpO2 95% He is an obese white male in no acute distress. He is normocephalic, atraumatic. Pupils are equal round and reactive to light and accommodation. Extraocular movements are full. Oropharynx is clear. Neck is supple no JVD, adenopathy, thyromegaly, or bruits. Lungs are clear. Cardiac exam reveals a regular rate and rhythm without gallop, murmur, or click. Abdomen is obese, soft, nontender. He has no edema. Pedal pulses are good. Skin is warm and dry. He is alert and oriented x3.  Cranial nerves II through XII are intact. There is no facial droop.  LABORATORY DATA:   Assessment / Plan: 1. Coronary disease status post stenting procedures as noted in history of present illness. Cardiac catheterization in March showed nonobstructive disease. We'll continue with risk factor modification. 2. Morbid obesity. 3. Hyperlipidemia. Recommend continued high-dose statin therapy. Based on recent data I recommended stopping Niaspan. 4. Hypertension-continue metoprolol

## 2013-07-08 NOTE — Patient Instructions (Signed)
You can stop taking Niaspan  Continue your other therapy  I will see you in 6 months.

## 2014-02-06 ENCOUNTER — Other Ambulatory Visit: Payer: Self-pay

## 2014-02-06 MED ORDER — METOPROLOL SUCCINATE ER 50 MG PO TB24: 50.0000 mg | ORAL_TABLET | Freq: Every day | ORAL | Status: DC

## 2014-02-06 MED ORDER — CLOPIDOGREL BISULFATE 75 MG PO TABS: 75.0000 mg | ORAL_TABLET | Freq: Every day | ORAL | Status: DC

## 2014-02-06 MED ORDER — OMEPRAZOLE 20 MG PO CPDR: 20.0000 mg | DELAYED_RELEASE_CAPSULE | Freq: Every day | ORAL | Status: DC

## 2014-02-06 MED ORDER — ATORVASTATIN CALCIUM 80 MG PO TABS: 80.0000 mg | ORAL_TABLET | Freq: Every day | ORAL | Status: DC

## 2014-03-27 ENCOUNTER — Ambulatory Visit (INDEPENDENT_AMBULATORY_CARE_PROVIDER_SITE_OTHER): Payer: Managed Care, Other (non HMO) | Admitting: Cardiology

## 2014-03-27 ENCOUNTER — Encounter: Payer: Self-pay | Admitting: Cardiology

## 2014-03-27 ENCOUNTER — Other Ambulatory Visit: Payer: Self-pay

## 2014-03-27 VITALS — BP 148/100 | HR 55 | Ht 68.0 in | Wt 290.0 lb

## 2014-03-27 DIAGNOSIS — I10 Essential (primary) hypertension: Secondary | ICD-10-CM

## 2014-03-27 DIAGNOSIS — I2510 Atherosclerotic heart disease of native coronary artery without angina pectoris: Secondary | ICD-10-CM

## 2014-03-27 DIAGNOSIS — E785 Hyperlipidemia, unspecified: Secondary | ICD-10-CM

## 2014-03-27 MED ORDER — OMEPRAZOLE 20 MG PO CPDR: 20.0000 mg | DELAYED_RELEASE_CAPSULE | Freq: Every day | ORAL | Status: DC

## 2014-03-27 MED ORDER — METOPROLOL SUCCINATE ER 50 MG PO TB24: 50.0000 mg | ORAL_TABLET | Freq: Every day | ORAL | Status: DC

## 2014-03-27 MED ORDER — ATORVASTATIN CALCIUM 80 MG PO TABS: 80.0000 mg | ORAL_TABLET | Freq: Every day | ORAL | Status: DC

## 2014-03-27 MED ORDER — CLOPIDOGREL BISULFATE 75 MG PO TABS: 75.0000 mg | ORAL_TABLET | Freq: Every day | ORAL | Status: DC

## 2014-03-27 NOTE — Addendum Note (Signed)
Addended by: Tonita Phoenix on: 03/27/2014 04:02 PM   Modules accepted: Orders

## 2014-03-27 NOTE — Progress Notes (Signed)
Tim Heath Date of Birth: [birth year]   History of Present Illness: Tim Heath is seen today for followup. He has a history of coronary disease with stenting of the right coronary in 2003 with a Cypher stent. He had stenting of the ramus intermediate branch in 2004 with a Taxus stent. Cardiac catheterization in March 2014 showed nonobstructive disease. He states he has done very well. 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 This is much less stressful. He is doing better with his diet and has lost 9 lbs. Rarely has chest pain related to stress.  Current Outpatient Prescriptions on File Prior to Visit  Medication Sig Dispense Refill  . Acetaminophen (TYLENOL ARTHRITIS PAIN PO) Take by mouth 2 (two) times daily.        Marland Kitchen. aspirin 81 MG tablet Take 81 mg by mouth daily.        Marland Kitchen. atorvastatin (LIPITOR) 80 MG tablet Take 1 tablet (80 mg total) by mouth daily.  90 tablet  0  . clopidogrel (PLAVIX) 75 MG tablet Take 1 tablet (75 mg total) by mouth daily.  90 tablet  0  . metoprolol succinate (TOPROL-XL) 50 MG 24 hr tablet Take 1 tablet (50 mg total) by mouth daily.  90 tablet  0  . Multiple Vitamin (MULTIVITAMIN) tablet Take 1 tablet by mouth daily.        . nitroGLYCERIN (NITROSTAT) 0.4 MG SL tablet Place 1 tablet (0.4 mg total) under the tongue every 5 (five) minutes as needed for chest pain.  100 tablet  6  . omeprazole (PRILOSEC) 20 MG capsule Take 1 capsule (20 mg total) by mouth daily.  90 capsule  0   No current facility-administered medications on file prior to visit.    No Known Allergies  Past Medical History  Diagnosis Date  . Hypertension   . Hyperlipidemia   . Coronary artery disease     DES RCA 2003, DES intermediate 2004  . Morbid obesity   . History of Bell's palsy     Past Surgical History  Procedure Laterality Date  . Cardiac catheterization  04/12/2004    EF 55%  . Tonsillectomy    . Vasectomy       History  Smoking status  . Former Smoker  Smokeless tobacco  . Not on file    History  Alcohol Use No    Family History  Problem Relation Age of Onset  . Liver cancer Mother   . Emphysema Father   . Hypertension Brother   . Coronary artery disease Brother     Review of Systems: As noted in history of present illness. All other systems were reviewed and are negative.  Physical Exam: BP 148/100  Pulse 55  Ht 5\' 8"  (1.727 m)  Wt 290 lb (131.543 kg)  BMI 44.10 kg/m2 He is an obese white male in no acute distress. HEENT is normal. Neck is supple no JVD, adenopathy, thyromegaly, or bruits. Lungs are clear. Cardiac exam reveals a regular rate and rhythm without gallop, murmur, or click. Abdomen is obese, soft, nontender. He has trace edema. Pedal pulses are good. Skin is warm and dry. He is alert and oriented x3. Cranial nerves II through XII are intact. There is no facial droop.  LABORATORY DATA: Ecg NSR with normal Ecg. Rate 55 bpm.  Assessment / Plan: 1. Coronary disease status post stenting procedures as noted in history of present illness.  Cardiac catheterization in March 2014 showed nonobstructive disease. We'll continue with risk factor modification.  2. Morbid obesity. Encouraged continued weight loss.   3. Hyperlipidemia. On high-dose statin therapy. Will check fasting lab work today.  4. Hypertension-BP is  elevated today. Continue to work on lifestyle modification with increased aerobic activity and weight loss. If BP remains high may need additional therapy- probably with ACEi.

## 2014-03-27 NOTE — Patient Instructions (Signed)
We will check lab work today.  Be sure to get some aerobic exercise daily  Congratulations on losing weight. Keep it up!.  I will see you 6 months.

## 2014-03-28 LAB — BASIC METABOLIC PANEL
BUN: 19 mg/dL (ref 6–23)
CALCIUM: 9.1 mg/dL (ref 8.4–10.5)
CO2: 28 mEq/L (ref 19–32)
Chloride: 106 mEq/L (ref 96–112)
Creatinine, Ser: 0.6 mg/dL (ref 0.4–1.5)
GFR: 155.19 mL/min (ref >60.00)
Glucose, Bld: 81 mg/dL (ref 70–99)
Potassium: 4 mEq/L (ref 3.5–5.1)
SODIUM: 140 meq/L (ref 135–145)

## 2014-03-28 LAB — HEPATIC FUNCTION PANEL
ALBUMIN: 3.8 g/dL (ref 3.5–5.2)
ALK PHOS: 68 U/L (ref 39–117)
ALT: 66 U/L — ABNORMAL HIGH (ref 0–53)
AST: 40 U/L — AB (ref 0–37)
Bilirubin, Direct: 0.2 mg/dL (ref 0.0–0.3)
TOTAL PROTEIN: 6.2 g/dL (ref 6.0–8.3)
Total Bilirubin: 1.2 mg/dL (ref 0.2–1.2)

## 2014-03-28 LAB — LIPID PANEL
CHOLESTEROL: 122 mg/dL (ref 0–200)
HDL: 33 mg/dL — AB (ref >39.00)
LDL Cholesterol: 80 mg/dL (ref 0–99)
NonHDL: 89
TRIGLYCERIDES: 45 mg/dL (ref 0.0–149.0)
Total CHOL/HDL Ratio: 4
VLDL: 9 mg/dL (ref 0.0–40.0)

## 2014-03-31 ENCOUNTER — Telehealth: Payer: Self-pay | Admitting: Cardiology

## 2014-03-31 DIAGNOSIS — R7401 Elevation of levels of liver transaminase levels: Secondary | ICD-10-CM

## 2014-03-31 DIAGNOSIS — R740 Nonspecific elevation of levels of transaminase and lactic acid dehydrogenase [LDH]: Principal | ICD-10-CM

## 2014-03-31 NOTE — Telephone Encounter (Signed)
Returned call to patient lab results given.Advised to repeat liver panel in 3 months 07/01/14.

## 2014-03-31 NOTE — Telephone Encounter (Signed)
New message ° ° ° ° ° ° °Pt returning nurse call  °

## 2014-07-01 ENCOUNTER — Other Ambulatory Visit (INDEPENDENT_AMBULATORY_CARE_PROVIDER_SITE_OTHER): Payer: Managed Care, Other (non HMO)

## 2014-07-01 DIAGNOSIS — R7401 Elevation of levels of liver transaminase levels: Secondary | ICD-10-CM

## 2014-07-01 DIAGNOSIS — R740 Nonspecific elevation of levels of transaminase and lactic acid dehydrogenase [LDH]: Principal | ICD-10-CM

## 2014-07-01 DIAGNOSIS — R7402 Elevation of levels of lactic acid dehydrogenase (LDH): Secondary | ICD-10-CM

## 2014-07-01 LAB — HEPATIC FUNCTION PANEL
ALBUMIN: 3.5 g/dL (ref 3.5–5.2)
ALT: 51 U/L (ref 0–53)
AST: 42 U/L — ABNORMAL HIGH (ref 0–37)
Alkaline Phosphatase: 58 U/L (ref 39–117)
Bilirubin, Direct: 0 mg/dL (ref 0.0–0.3)
TOTAL PROTEIN: 6.3 g/dL (ref 6.0–8.3)
Total Bilirubin: 1 mg/dL (ref 0.2–1.2)

## 2014-10-03 ENCOUNTER — Ambulatory Visit (INDEPENDENT_AMBULATORY_CARE_PROVIDER_SITE_OTHER): Payer: Managed Care, Other (non HMO) | Admitting: Cardiology

## 2014-10-03 ENCOUNTER — Encounter: Payer: Self-pay | Admitting: Cardiology

## 2014-10-03 VITALS — BP 136/96 | HR 70 | Ht 68.0 in | Wt 285.9 lb

## 2014-10-03 DIAGNOSIS — E785 Hyperlipidemia, unspecified: Secondary | ICD-10-CM

## 2014-10-03 DIAGNOSIS — I10 Essential (primary) hypertension: Secondary | ICD-10-CM

## 2014-10-03 DIAGNOSIS — I2510 Atherosclerotic heart disease of native coronary artery without angina pectoris: Secondary | ICD-10-CM

## 2014-10-03 NOTE — Patient Instructions (Signed)
Continue your current therapy  I will see you in 6 months.   

## 2014-10-03 NOTE — Progress Notes (Signed)
Tim Heath Date of Birth: 07-27-1961   History of Present Illness: Tim Heath is seen today for followup of CAD. He has a history of coronary disease with stenting of the right coronary in 2003 with a Cypher stent. He had stenting of the ramus intermediate branch in 2004 with a Taxus stent. Cardiac catheterization in March 2014 showed nonobstructive disease.  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 He states this is still very stressful and sometimes he feels very exhausted and fatigued at work. He sometimes has chest pain. If he goes home and rests all his symptoms resolve.   Current Outpatient Prescriptions on File Prior to Visit  Medication Sig Dispense Refill  . Acetaminophen (TYLENOL ARTHRITIS PAIN PO) Take by mouth 2 (two) times daily.      Marland Kitchen. aspirin 81 MG tablet Take 81 mg by mouth daily.      Marland Kitchen. atorvastatin (LIPITOR) 80 MG tablet Take 1 tablet (80 mg total) by mouth daily. 90 tablet 3  . clopidogrel (PLAVIX) 75 MG tablet Take 1 tablet (75 mg total) by mouth daily. 90 tablet 3  . metoprolol succinate (TOPROL-XL) 50 MG 24 hr tablet Take 1 tablet (50 mg total) by mouth daily. 90 tablet 3  . Multiple Vitamin (MULTIVITAMIN) tablet Take 1 tablet by mouth daily.      . nitroGLYCERIN (NITROSTAT) 0.4 MG SL tablet Place 1 tablet (0.4 mg total) under the tongue every 5 (five) minutes as needed for chest pain. 100 tablet 6  . omeprazole (PRILOSEC) 20 MG capsule Take 1 capsule (20 mg total) by mouth daily. 90 capsule 3   No current facility-administered medications on file prior to visit.    No Known Allergies  Past Medical History  Diagnosis Date  . Hypertension   . Hyperlipidemia   . Coronary artery disease     DES RCA 2003, DES intermediate 2004  . Morbid obesity   . History of Bell's palsy     Past Surgical History  Procedure Laterality Date  . Cardiac catheterization  04/12/2004    EF 55%  . Tonsillectomy    .  Vasectomy      History  Smoking status  . Former Smoker  Smokeless tobacco  . Not on file    History  Alcohol Use No    Family History  Problem Relation Age of Onset  . Liver cancer Mother   . Emphysema Father   . Hypertension Brother   . Coronary artery disease Brother     Review of Systems: As noted in history of present illness. All other systems were reviewed and are negative.  Physical Exam: BP 136/96 mmHg  Pulse 70  Ht 5\' 8"  (1.727 m)  Wt 285 lb 14.4 oz (129.683 kg)  BMI 43.48 kg/m2 He is an obese white male in no acute distress. HEENT is normal. Neck is supple no JVD, adenopathy, thyromegaly, or bruits. Lungs are clear. Cardiac exam reveals a regular rate and rhythm without gallop, murmur, or click. Abdomen is obese, soft, nontender. He has trace edema. Pedal pulses are good. Skin is warm and dry. He is alert and oriented x3. Cranial nerves II through XII are intact. There is no facial droop.  LABORATORY DATA: Ecg NSR with normal Ecg. Rate 70 bpm.  Lab Results  Component Value Date   WBC 7.0 01/17/2013   HGB 16.2 01/17/2013   HCT 48.5 01/17/2013   PLT 177.0  01/17/2013   GLUCOSE 81 03/27/2014   CHOL 122 03/27/2014   TRIG 45.0 03/27/2014   HDL 33.00* 03/27/2014   LDLCALC 80 03/27/2014   ALT 51 07/01/2014   AST 42* 07/01/2014   NA 140 03/27/2014   K 4.0 03/27/2014   CL 106 03/27/2014   CREATININE 0.6 03/27/2014   BUN 19 03/27/2014   CO2 28 03/27/2014   INR 1.0 01/17/2013   HGBA1C 6.0 05/20/2011    Assessment / Plan: 1. Coronary disease status post stenting procedures as noted in history of present illness. Cardiac catheterization in March 2014 showed nonobstructive disease. He does have chest pain that appears to be more stress related. We discussed methods of dealing with stress including aerobic exercise, meditation, and sometimes medication. I encouraged him to discuss with his primary MD.   2. Morbid obesity. Encouraged continued weight loss.    3. Hyperlipidemia. On high-dose statin therapy.   4. Hypertension-BP is controlled.

## 2015-01-03 ENCOUNTER — Emergency Department (HOSPITAL_COMMUNITY): Payer: Managed Care, Other (non HMO)

## 2015-01-03 ENCOUNTER — Emergency Department (HOSPITAL_COMMUNITY)
Admission: EM | Admit: 2015-01-03 | Discharge: 2015-01-03 | Disposition: A | Discharge status: Home / Self Care | Payer: Managed Care, Other (non HMO) | Attending: Emergency Medicine | Admitting: Emergency Medicine

## 2015-01-03 ENCOUNTER — Encounter (HOSPITAL_COMMUNITY): Payer: Self-pay | Admitting: Emergency Medicine

## 2015-01-03 DIAGNOSIS — N200 Calculus of kidney: Secondary | ICD-10-CM | POA: Diagnosis not present

## 2015-01-03 DIAGNOSIS — Z79899 Other long term (current) drug therapy: Secondary | ICD-10-CM | POA: Diagnosis not present

## 2015-01-03 DIAGNOSIS — Z9889 Other specified postprocedural states: Secondary | ICD-10-CM | POA: Diagnosis not present

## 2015-01-03 DIAGNOSIS — Z8669 Personal history of other diseases of the nervous system and sense organs: Secondary | ICD-10-CM | POA: Insufficient documentation

## 2015-01-03 DIAGNOSIS — Z7902 Long term (current) use of antithrombotics/antiplatelets: Secondary | ICD-10-CM | POA: Diagnosis not present

## 2015-01-03 DIAGNOSIS — R109 Unspecified abdominal pain: Secondary | ICD-10-CM | POA: Diagnosis present

## 2015-01-03 DIAGNOSIS — Z7982 Long term (current) use of aspirin: Secondary | ICD-10-CM | POA: Insufficient documentation

## 2015-01-03 DIAGNOSIS — E785 Hyperlipidemia, unspecified: Secondary | ICD-10-CM | POA: Insufficient documentation

## 2015-01-03 DIAGNOSIS — Z87891 Personal history of nicotine dependence: Secondary | ICD-10-CM | POA: Diagnosis not present

## 2015-01-03 DIAGNOSIS — I2510 Atherosclerotic heart disease of native coronary artery without angina pectoris: Secondary | ICD-10-CM | POA: Insufficient documentation

## 2015-01-03 DIAGNOSIS — I10 Essential (primary) hypertension: Secondary | ICD-10-CM | POA: Insufficient documentation

## 2015-01-03 LAB — BASIC METABOLIC PANEL
Anion gap: 10 (ref 5–15)
BUN: 19 mg/dL (ref 6–23)
CO2: 25 mmol/L (ref 19–32)
CREATININE: 0.93 mg/dL (ref 0.50–1.35)
Calcium: 9 mg/dL (ref 8.4–10.5)
Chloride: 106 mmol/L (ref 96–112)
GFR calc Af Amer: >90 mL/min (ref >90)
GFR calc non Af Amer: >90 mL/min (ref >90)
GLUCOSE: 152 mg/dL — AB (ref 70–99)
Potassium: 4.1 mmol/L (ref 3.5–5.1)
Sodium: 141 mmol/L (ref 135–145)

## 2015-01-03 LAB — URINALYSIS, ROUTINE W REFLEX MICROSCOPIC
GLUCOSE, UA: NEGATIVE mg/dL
KETONES UR: 15 mg/dL — AB
Nitrite: NEGATIVE
PH: 5 (ref 5.0–8.0)
PROTEIN: 100 mg/dL — AB
SPECIFIC GRAVITY, URINE: 1.031 — AB (ref 1.005–1.030)
UROBILINOGEN UA: 0.2 mg/dL (ref 0.0–1.0)

## 2015-01-03 LAB — CBC
HCT: 46.6 % (ref 39.0–52.0)
Hemoglobin: 15.6 g/dL (ref 13.0–17.0)
MCH: 30.9 pg (ref 26.0–34.0)
MCHC: 33.5 g/dL (ref 30.0–36.0)
MCV: 92.3 fL (ref 78.0–100.0)
Platelets: 198 10*3/uL (ref 150–400)
RBC: 5.05 MIL/uL (ref 4.22–5.81)
RDW: 12.4 % (ref 11.5–15.5)
WBC: 12.8 10*3/uL — ABNORMAL HIGH (ref 4.0–10.5)

## 2015-01-03 LAB — URINE MICROSCOPIC-ADD ON

## 2015-01-03 MED ORDER — ONDANSETRON 4 MG PO TBDP: 4.0000 mg | ORAL_TABLET | Freq: Three times a day (TID) | ORAL | Status: DC | PRN

## 2015-01-03 MED ORDER — SODIUM CHLORIDE 0.9 % IV BOLUS (SEPSIS)
1000.0000 mL | Freq: Once | INTRAVENOUS | Status: AC
  Administered 2015-01-03: 1000 mL via INTRAVENOUS

## 2015-01-03 MED ORDER — HYDROMORPHONE HCL 1 MG/ML IJ SOLN
1.0000 mg | Freq: Once | INTRAMUSCULAR | Status: AC
  Administered 2015-01-03: 1 mg via INTRAVENOUS
  Filled 2015-01-03: qty 1

## 2015-01-03 MED ORDER — ONDANSETRON HCL 4 MG/2ML IJ SOLN
4.0000 mg | Freq: Once | INTRAMUSCULAR | Status: AC
  Administered 2015-01-03: 4 mg via INTRAVENOUS
  Filled 2015-01-03: qty 2

## 2015-01-03 MED ORDER — TAMSULOSIN HCL 0.4 MG PO CAPS: 0.4000 mg | ORAL_CAPSULE | Freq: Every day | ORAL | Status: DC

## 2015-01-03 MED ORDER — MORPHINE SULFATE 4 MG/ML IJ SOLN
4.0000 mg | Freq: Once | INTRAMUSCULAR | Status: AC
  Administered 2015-01-03: 4 mg via INTRAVENOUS
  Filled 2015-01-03: qty 1

## 2015-01-03 MED ORDER — OXYCODONE-ACETAMINOPHEN 5-325 MG PO TABS: 1.0000 | ORAL_TABLET | ORAL | Status: DC | PRN

## 2015-01-03 MED ORDER — KETOROLAC TROMETHAMINE 30 MG/ML IJ SOLN
30.0000 mg | Freq: Once | INTRAMUSCULAR | Status: AC
  Administered 2015-01-03: 30 mg via INTRAVENOUS
  Filled 2015-01-03: qty 1

## 2015-01-03 NOTE — ED Provider Notes (Signed)
CSN: [id number]     Arrival date & time 01/03/15  [id number] History   First MD Initiated Contact with Patient 01/03/15 0600     Chief Complaint  Patient presents with  . Flank Pain     (Consider location/radiation/quality/duration/timing/severity/associated sxs/prior Treatment) HPI Comments: Patient is a 54 year old male past medical history significant for hypertension, hyperlipidemia, CAD presenting to the emergency department for acute onset left-sided flank pain in around 1 AM this morning. Describes his pain as severe and colicky in nature without radiation. Endorses associated nausea without emesis. He attempted to take an old hydrocodone from his last kidney stone 2 years ago without relief. No modifying factors identified. Denies any fevers, chills, urinary symptoms, diarrhea, constipation. No abdominal surgical history noted.  Patient is a 54 y.o. male presenting with flank pain.  Flank Pain    Past Medical History  Diagnosis Date  . Hypertension   . Hyperlipidemia   . Coronary artery disease     DES RCA 2003, DES intermediate 2004  . Morbid obesity   . History of Bell's palsy    Past Surgical History  Procedure Laterality Date  . Cardiac catheterization  04/12/2004    EF 55%  . Tonsillectomy    . Vasectomy    . Coronary stent placement  2003, 2004   Family History  Problem Relation Age of Onset  . Liver cancer Mother   . Emphysema Father   . Hypertension Brother   . Coronary artery disease Brother    History  Substance Use Topics  . Smoking status: Former Games developermoker  . Smokeless tobacco: Not on file  . Alcohol Use: No    Review of Systems  Genitourinary: Positive for flank pain.  All other systems reviewed and are negative.     Allergies  Review of patient's allergies indicates no known allergies.  Home Medications   Prior to Admission medications   Medication Sig Start Date End Date Taking? Authorizing Provider  aspirin 81 MG tablet Take 81 mg by mouth  daily.     Yes Historical Provider, MD  atorvastatin (LIPITOR) 80 MG tablet Take 1 tablet (80 mg total) by mouth daily. 03/27/14  Yes Peter M SwazilandJordan, MD  clopidogrel (PLAVIX) 75 MG tablet Take 1 tablet (75 mg total) by mouth daily. 03/27/14  Yes Peter M [place]Jordan, MD  HYDROcodone-acetaminophen (NORCO/VICODIN) 5-325 MG per tablet Take 1 tablet by mouth once.   Yes Historical Provider, MD  metoprolol succinate (TOPROL-XL) 50 MG 24 hr tablet Take 1 tablet (50 mg total) by mouth daily. 03/27/14  Yes Peter M [place]Jordan, MD  Multiple Vitamin (MULTIVITAMIN) tablet Take 1 tablet by mouth daily.     Yes Historical Provider, MD  nitroGLYCERIN (NITROSTAT) 0.4 MG SL tablet Place 1 tablet (0.4 mg total) under the tongue every 5 (five) minutes as needed for chest pain. 01/17/13  Yes Peter M [place]Jordan, MD  omeprazole (PRILOSEC) 20 MG capsule Take 1 capsule (20 mg total) by mouth daily. 03/27/14  Yes Peter M [place]andJordan, MD  Acetaminophen (TYLENOL ARTHRITIS PAIN PO) Take by mouth 2 (two) times daily.      Historical Provider, MD  ondansetron (ZOFRAN ODT) 4 MG disintegrating tablet Take 1 tablet (4 mg total) by mouth every 8 (eight) hours as needed for nausea or vomiting. 01/03/15   Francee PiccoloJennifer Juneau Doughman, PA-C  oxyCODONE-acetaminophen (PERCOCET/ROXICET) 5-325 MG per tablet Take 1 tablet by mouth every 4 (four) hours as needed for severe pain. May take 2 tablets PO q 6 hours for  severe pain - Do not take with Tylenol as this tablet already contains tylenol 01/03/15   Francee Piccolo, PA-C  tamsulosin (FLOMAX) 0.4 MG CAPS capsule Take 1 capsule (0.4 mg total) by mouth daily. 01/03/15   Lainee Lehrman, PA-C   BP 138/83 mmHg  Pulse 75  Temp(Src) 98.7 F (37.1 C) (Oral)  Resp 17  Ht  (1.727 m)  Wt 285 lb (129.275 kg)  BMI 43.34 kg/m2  SpO2 92% Physical Exam  Constitutional: He is oriented to person, place, and time. He appears well-developed and well-nourished. No distress.  HENT:  Head: Normocephalic and atraumatic.  Right  Ear: External ear normal.  Left Ear: External ear normal.  Nose: Nose normal.  Mouth/Throat: Oropharynx is clear and moist.  Eyes: Conjunctivae are normal.  Neck: Neck supple.  Cardiovascular: Normal rate, regular rhythm and normal heart sounds.   Pulmonary/Chest: Effort normal and breath sounds normal.  Abdominal: Soft. Bowel sounds are normal. There is no tenderness. There is CVA tenderness (left). There is no rigidity and no guarding.  Neurological: He is alert and oriented to person, place, and time.  Moves all extremities without ataxia.   Skin: Skin is warm and dry. He is not diaphoretic.  Nursing note and vitals reviewed.   ED Course  Procedures (including critical care time) Medications  sodium chloride 0.9 % bolus 1,000 mL (0 mLs Intravenous Stopped 01/03/15 0800)  morphine 4 MG/ML injection 4 mg (4 mg Intravenous Given 01/03/15 0635)  ondansetron (ZOFRAN) injection 4 mg (4 mg Intravenous Given 01/03/15 0631)  HYDROmorphone (DILAUDID) injection 1 mg (1 mg Intravenous Given 01/03/15 0729)  ketorolac (TORADOL) 30 MG/ML injection 30 mg (30 mg Intravenous Given 01/03/15 0820)  HYDROmorphone (DILAUDID) injection 1 mg (1 mg Intravenous Given 01/03/15 1103)    Labs Review Labs Reviewed  URINALYSIS, ROUTINE W REFLEX MICROSCOPIC - Abnormal; Notable for the following:    Color, Urine AMBER (*)    APPearance TURBID (*)    Specific Gravity, Urine 1.031 (*)    Hgb urine dipstick LARGE (*)    Bilirubin Urine MODERATE (*)    Ketones, ur 15 (*)    Protein, ur 100 (*)    Leukocytes, UA SMALL (*)    All other components within normal limits  CBC - Abnormal; Notable for the following:    WBC 12.8 (*)    All other components within normal limits  BASIC METABOLIC PANEL - Abnormal; Notable for the following:    Glucose, Bld 152 (*)    All other components within normal limits  URINE MICROSCOPIC-ADD ON - Abnormal; Notable for the following:    Bacteria, UA MANY (*)    Crystals CA OXALATE  CRYSTALS (*)    All other components within normal limits  URINE CULTURE    Imaging Review Ct Renal Stone Study  01/03/2015   CLINICAL DATA:  54 year old male with a history of left flank pain since 1 a.m. this morning. History of kidney stones. History of hematuria.  EXAM: CT ABDOMEN AND PELVIS WITHOUT CONTRAST  TECHNIQUE: Multidetector CT imaging of the abdomen and pelvis was performed following the standard protocol without IV contrast.  COMPARISON:  CT 11/04/2010  FINDINGS: Lower chest:  Unremarkable appearance of the soft tissues of the chest wall.  Calcifications of the coronary vasculature. Calcifications of the aortic valve.  No evidence of pericardial fluid/ thickening. Heart size within normal limits.  Linear opacities bilateral lung bases compatible of atelectasis/scarring. No pleural effusion or confluent airspace disease.  Abdomen/pelvis:  Differential attenuation of liver parenchyma. Right liver lobe measures less than the left. This was present on the comparison.  Unremarkable appearance of spleen and bilateral adrenal glands.  Small hiatal hernia.  Surgical changes of cholecystectomy without intrahepatic or extrahepatic biliary ductal dilatation.  No abnormally distended small bowel or colon. Normal appendix. No inflammatory changes within the mesenteries.  No evidence of right-sided hydronephrosis. No right-sided nephrolithiasis. Right ureter within normal limits.  Small stone measuring 2 mm - 3 mm at the left ureteropelvic junction. Above the stone there is mild pelvicaliectasis. Mild left-sided perinephric stranding. No additional left-sided stones.  Unremarkable appearance of the urinary bladder, seminal vesicles, prostate. Pelvic phleboliths.  Scattered atherosclerotic changes of the vasculature.  No aneurysm.  No significant degenerative changes of the spine. No bony canal stenosis. No displaced fracture. No aggressive bony lesions identified.  IMPRESSION: Obstructing left-sided ureteral  stone at the ureteropelvic junction measuring 2 mm - 3 mm. There is mild pelvicaliectasis and perinephric stranding. If there is concern for left-sided pyelonephritis, recommend correlation with urinalysis.  Geographic fat of the liver.  Atherosclerosis and evidence of coronary artery disease.  Additional findings as above.  These results were called by telephone at the time of interpretation on 01/03/2015 at 10:23 am to Dr. Francee Piccolo , who verbally acknowledged these results.  Signed,  Yvone Neu. Loreta Ave, DO  [facility]  [facility]   Electronically Signed   By: Gilmer Mor D.O.   On: 01/03/2015 10:25     EKG Interpretation None      MDM   Final diagnoses:  Flank pain  Kidney stone    Filed Vitals:   01/03/15 1140  BP: 138/83  Pulse: 75  Temp:   Resp: 17   Afebrile, NAD, non-toxic appearing, AAOx4.  I have reviewed nursing notes, vital signs, and all appropriate lab and imaging results for this patient. Pt has been diagnosed with a Kidney Stone via CT. There is no evidence of significant hydronephrosis, serum creatine WNL, vitals sign stable and the pt does not have irratractable vomiting. Pt will be dc home with pain medications & has been advised to follow up with PCP. Patient is stable at time of discharge. Patient d/w with Dr. Rubin Payor, agrees with plan.      Francee Piccolo, PA-C 01/03/15 1623  Richardean Canal, MD 01/05/15 1014

## 2015-01-03 NOTE — Discharge Instructions (Signed)
Please follow up with your primary care physician in 1-2 days. If you do not have one please call the [facility] number listed above. Please follow up with Dr. Marlou PorchHerrick to schedule a follow up appointment.  Please take pain medication and/or muscle relaxants as prescribed and as needed for pain. Please do not drive on narcotic pain medication or on muscle relaxants. Please read all discharge instructions and return precautions.   Kidney Stones Kidney stones (urolithiasis) are deposits that form inside your kidneys. The intense pain is caused by the stone moving through the urinary tract. When the stone moves, the ureter goes into spasm around the stone. The stone is usually passed in the urine.  CAUSES   A disorder that makes certain neck glands produce too much parathyroid hormone (primary hyperparathyroidism).  A buildup of uric acid crystals, similar to gout in your joints.  Narrowing (stricture) of the ureter.  A kidney obstruction present at birth (congenital obstruction).  Previous surgery on the kidney or ureters.  Numerous kidney infections. SYMPTOMS   Feeling sick to your stomach (nauseous).  Throwing up (vomiting).  Blood in the urine (hematuria).  Pain that usually spreads (radiates) to the groin.  Frequency or urgency of urination. DIAGNOSIS   Taking a history and physical exam.  Blood or urine tests.  CT scan.  Occasionally, an examination of the inside of the urinary bladder (cystoscopy) is performed. TREATMENT   Observation.  Increasing your fluid intake.  Extracorporeal shock wave lithotripsy--This is a noninvasive procedure that uses shock waves to break up kidney stones.  Surgery may be needed if you have severe pain or persistent obstruction. There are various surgical procedures. Most of the procedures are performed with the use of small instruments. Only small incisions are needed to accommodate these instruments, so recovery time  is minimized. The size, location, and chemical composition are all important variables that will determine the proper choice of action for you. Talk to your health care provider to better understand your situation so that you will minimize the risk of injury to yourself and your kidney.  HOME CARE INSTRUCTIONS   Drink enough water and fluids to keep your urine clear or pale yellow. This will help you to pass the stone or stone fragments.  Strain all urine through the provided strainer. Keep all particulate matter and stones for your health care provider to see. The stone causing the pain may be as small as a grain of salt. It is very important to use the strainer each and every time you pass your urine. The collection of your stone will allow your health care provider to analyze it and verify that a stone has actually passed. The stone analysis will often identify what you can do to reduce the incidence of recurrences.  Only take over-the-counter or prescription medicines for pain, discomfort, or fever as directed by your health care provider.  Make a follow-up appointment with your health care provider as directed.  Get follow-up X-rays if required. The absence of pain does not always mean that the stone has passed. It may have only stopped moving. If the urine remains completely obstructed, it can cause loss of kidney function or even complete destruction of the kidney. It is your responsibility to make sure X-rays and follow-ups are completed. Ultrasounds of the kidney can show blockages and the status of the kidney. Ultrasounds are not associated with any radiation and can be performed easily in a matter of minutes. SEEK  MEDICAL CARE IF:  You experience pain that is progressive and unresponsive to any pain medicine you have been prescribed. SEEK IMMEDIATE MEDICAL CARE IF:   Pain cannot be controlled with the prescribed medicine.  You have a fever or shaking chills.  The severity or  intensity of pain increases over 18 hours and is not relieved by pain medicine.  You develop a new onset of abdominal pain.  You feel faint or pass out.  You are unable to urinate. MAKE SURE YOU:   Understand these instructions.  Will watch your condition.  Will get help right away if you are not doing well or get worse. Document Released: 10/10/2005 Document Revised: 06/12/2013 Document Reviewed: 03/13/2013 [facility] Patient Information 2015 [place], [place]. This information is not intended to replace advice given to you by your health care provider. Make sure you discuss any questions you have with your health care provider.

## 2015-01-03 NOTE — ED Notes (Signed)
Pt reports left flank pain since 0100 this AM. Pt has hx of kidney stones, and states that this feels similar. Pt states "I took a 500 mg hydrocodeine at 3AM". Pt is unable to sit still.

## 2015-01-04 LAB — URINE CULTURE

## 2015-03-30 ENCOUNTER — Ambulatory Visit (INDEPENDENT_AMBULATORY_CARE_PROVIDER_SITE_OTHER): Payer: Managed Care, Other (non HMO) | Admitting: Cardiology

## 2015-03-30 ENCOUNTER — Encounter: Payer: Self-pay | Admitting: Cardiology

## 2015-03-30 VITALS — BP 168/90 | HR 70 | Ht 68.0 in | Wt 290.0 lb

## 2015-03-30 DIAGNOSIS — I2510 Atherosclerotic heart disease of native coronary artery without angina pectoris: Secondary | ICD-10-CM

## 2015-03-30 DIAGNOSIS — E785 Hyperlipidemia, unspecified: Secondary | ICD-10-CM | POA: Diagnosis not present

## 2015-03-30 DIAGNOSIS — I10 Essential (primary) hypertension: Secondary | ICD-10-CM

## 2015-03-30 NOTE — Progress Notes (Signed)
Tim Heath Date of Birth: 09-29-1961   History of Present Illness: Tim Heath is seen today for followup of CAD. He has a history of coronary disease with stenting of the right coronary in 2003 with a Cypher stent. He had stenting of the ramus intermediate branch in 2004 with a Taxus stent. Cardiac catheterization in March 2014 showed nonobstructive disease.  He reports that his job stress is much less now. He is having no chest pain or SOB. He still struggles with his weight and has gained 5 lbs. He was seen in the ED 2 months ago with a kidney stone and continues to have pain related to this. He is scheduled to see urology.   Current Outpatient Prescriptions on File Prior to Visit  Medication Sig Dispense Refill  . HYDROcodone-acetaminophen (NORCO/VICODIN) 5-325 MG per tablet Take 1 tablet by mouth once.    . metoprolol succinate (TOPROL-XL) 50 MG 24 hr tablet Take 1 tablet (50 mg total) by mouth daily. 90 tablet 3  . Multiple Vitamin (MULTIVITAMIN) tablet Take 1 tablet by mouth daily.      . nitroGLYCERIN (NITROSTAT) 0.4 MG SL tablet Place 1 tablet (0.4 mg total) under the tongue every 5 (five) minutes as needed for chest pain. 100 tablet 6  . omeprazole (PRILOSEC) 20 MG capsule Take 1 capsule (20 mg total) by mouth daily. 90 capsule 3  . ondansetron (ZOFRAN ODT) 4 MG disintegrating tablet Take 1 tablet (4 mg total) by mouth every 8 (eight) hours as needed for nausea or vomiting. 20 tablet 0  . oxyCODONE-acetaminophen (PERCOCET/ROXICET) 5-325 MG per tablet Take 1 tablet by mouth every 4 (four) hours as needed for severe pain. May take 2 tablets PO q 6 hours for severe pain - Do not take with Tylenol as this tablet already contains tylenol 20 tablet 0  . tamsulosin (FLOMAX) 0.4 MG CAPS capsule Take 1 capsule (0.4 mg total) by mouth daily. 10 capsule 0  . Acetaminophen (TYLENOL ARTHRITIS PAIN PO) Take by mouth 2 (two) times daily.      Marland Kitchen. aspirin 81 MG tablet Take 81 mg by mouth daily.      Marland Kitchen.  atorvastatin (LIPITOR) 80 MG tablet Take 1 tablet (80 mg total) by mouth daily. (Patient not taking: Reported on 03/30/2015) 90 tablet 3  . clopidogrel (PLAVIX) 75 MG tablet Take 1 tablet (75 mg total) by mouth daily. (Patient not taking: Reported on 03/30/2015) 90 tablet 3   No current facility-administered medications on file prior to visit.    No Known Allergies  Past Medical History  Diagnosis Date  . Hypertension   . Hyperlipidemia   . Coronary artery disease     DES RCA 2003, DES intermediate 2004  . Morbid obesity   . History of Bell's palsy     Past Surgical History  Procedure Laterality Date  . Cardiac catheterization  04/12/2004    EF 55%  . Tonsillectomy    . Vasectomy    . Coronary stent placement  2003, 2004    History  Smoking status  . Former Smoker  . Quit date: 03/29/1986  Smokeless tobacco  . Not on file    History  Alcohol Use No    Family History  Problem Relation Age of Onset  . Liver cancer Mother   . Emphysema Father   . Hypertension Brother   . Coronary artery disease Brother     Review of Systems: As noted in history of present illness. All other systems  were reviewed and are negative.  Physical Exam: BP 168/90 mmHg  Pulse 70  Ht  (1.727 m)  Wt 131.543 kg (290 lb)  BMI 44.10 kg/m2 He is an obese white male in no acute distress. HEENT is normal. Neck is supple no JVD, adenopathy, thyromegaly, or bruits. Lungs are clear. Cardiac exam reveals a regular rate and rhythm without gallop, murmur, or click. Abdomen is obese, soft, nontender. He has trace edema. Pedal pulses are good. Skin is warm and dry. He is alert and oriented x3. Cranial nerves II through XII are intact. There is no facial droop.  LABORATORY DATA:   Lab Results  Component Value Date   WBC 12.8* 01/03/2015   HGB 15.6 01/03/2015   HCT 46.6 01/03/2015   PLT 198 01/03/2015   GLUCOSE 152* 01/03/2015   CHOL 122 03/27/2014   TRIG 45.0 03/27/2014   HDL 33.00*  03/27/2014   LDLCALC 80 03/27/2014   ALT 51 07/01/2014   AST 42* 07/01/2014   NA 141 01/03/2015   K 4.1 01/03/2015   CL 106 01/03/2015   CREATININE 0.93 01/03/2015   BUN 19 01/03/2015   CO2 25 01/03/2015   INR 1.0 01/17/2013   HGBA1C 6.0 05/20/2011    Assessment / Plan: 1. Coronary disease status post stenting procedures as noted in history of present illness. Cardiac catheterization in March 2014 showed nonobstructive disease. He is currently asymptomatic. Continue risk factor modification.  2. Morbid obesity. Encouraged continued weight loss.   3. Hyperlipidemia. On high-dose statin therapy.   4. Hypertension-BP is elevated today but has been in good control- even on ED visit. Will monitor at home and call if it remains elevated.   5. Renal calculi. Follow up with urology.

## 2015-03-30 NOTE — Patient Instructions (Signed)
Continue your current therapy   Keep an eye on your blood pressure.   

## 2015-04-01 ENCOUNTER — Telehealth: Payer: Self-pay | Admitting: Cardiology

## 2015-04-01 DIAGNOSIS — I10 Essential (primary) hypertension: Secondary | ICD-10-CM

## 2015-04-01 MED ORDER — LISINOPRIL 10 MG PO TABS: 10.0000 mg | ORAL_TABLET | Freq: Every day | ORAL | Status: DC

## 2015-04-01 NOTE — Telephone Encounter (Signed)
Please call,pt says his blood pressure is still up high.

## 2015-04-01 NOTE — Telephone Encounter (Signed)
Returned call to patient Dr.Jordan advised start lisinopril 10 mg daily.Bmet in 1 week.Advised to call back if continues to have elevated B/P.

## 2015-04-01 NOTE — Telephone Encounter (Signed)
Returned call to patient he stated his B/P is elevated today 169/96 pulse 74.Yesterday B/P 171.Stated he still has kidney stone and has a call into urologist to set up surgery.No kidney stone pain at present just has a headache from elevated B/P.Stated he has vacation scheduled in a couple of weeks and wants to get B/P down.Advised I will speak to Dr.Jordan this afternoon and call you back.

## 2015-04-02 ENCOUNTER — Other Ambulatory Visit: Payer: Self-pay | Admitting: Urology

## 2015-04-03 ENCOUNTER — Telehealth: Payer: Self-pay

## 2015-04-03 NOTE — Telephone Encounter (Signed)
Received a surgical clearance from Dr.Eskridge's office.Dr.Jordan cleared pt for surgery.Advised ok to hold plavix 7 days prior to surgery.Form faxed back to fax # [phone].

## 2015-04-06 ENCOUNTER — Telehealth: Payer: Self-pay | Admitting: Cardiology

## 2015-04-06 NOTE — Telephone Encounter (Signed)
BP control is not where we want- consistently less than 140 systolic. Recommend increasing lisinopril to 20 mg daily.  Jniya Madara [place] MD, [facility]

## 2015-04-06 NOTE — Telephone Encounter (Signed)
Pt started taking a new blood pressure medicine last Wednesday. He wants to make his numbers are back where they need to be.

## 2015-04-06 NOTE — Telephone Encounter (Signed)
Returned call to patient calling to report B/P readings 144/88,150/88 pulse 76,154/89 p 66,156/94 p 91,148/88 p 89,152/94 p 68,156/89 p 75,151/80 p 68,147/94 p 82.Message sent to Dr.Jordan for advice.

## 2015-04-07 ENCOUNTER — Telehealth: Payer: Self-pay | Admitting: Cardiology

## 2015-04-07 MED ORDER — LISINOPRIL 20 MG PO TABS: 20.0000 mg | ORAL_TABLET | Freq: Every day | ORAL | Status: DC

## 2015-04-07 NOTE — Telephone Encounter (Signed)
Returned call to patient no answer.Unable to leave voice mail no voice mail.

## 2015-04-07 NOTE — Telephone Encounter (Signed)
Pt is returning Cheryl's call in regards to his BP . Please call him back   Thanks

## 2015-04-07 NOTE — Telephone Encounter (Signed)
Returned call to patient Dr.Jordan advised to increase lisinopril to 20 mg daily.Advised to continue to monitor B/P and call back if continues to be elevated.

## 2015-04-07 NOTE — Telephone Encounter (Signed)
See previous 04/07/15 note.

## 2015-04-07 NOTE — Addendum Note (Signed)
Addended by: Meda Klinefelter D on: 04/07/2015 09:32 AM   Modules accepted: Orders, Medications

## 2015-04-07 NOTE — Telephone Encounter (Signed)
Returned call to patient no answer.No voice mail. 

## 2015-04-09 LAB — BASIC METABOLIC PANEL
BUN: 14 mg/dL (ref 6–23)
CO2: 30 mEq/L (ref 19–32)
Calcium: 8.9 mg/dL (ref 8.4–10.5)
Chloride: 104 mEq/L (ref 96–112)
Creat: 0.65 mg/dL (ref 0.50–1.35)
GLUCOSE: 83 mg/dL (ref 70–99)
Potassium: 4.2 mEq/L (ref 3.5–5.3)
Sodium: 141 mEq/L (ref 135–145)

## 2015-04-17 NOTE — H&P (Signed)
History of Present Illness          F/u - PCP Dr. Holley Bouche      1 - nephrolithiasis - patient with flank pain and gross hematuria on and off since 2012. He presented March 2016 when a CT revealed a 2-3 mm left distal ureteral calculus.  -Apr 2016 - left distal stone, KUB -possible left pelvic stone , small stone superior to some pelvic phleboliths.    2-gross hematuria - initially seen in 2012 with weakstream, frequency, severe right flank pain, urgency. MH on U/A. CT - negative. NMP-22 was negative x 2. He was continued on tamsulosin.   -Jul 2013 - gross hematuria flank pain, passed a stone   -Sep 2013 normal cystoscopy ; Renal U/S benign. Nl PVR.     3-PCa screening -  -Oct 2012 PSA 0.41   -May 2013 nl DRE.    -Apr 2016 PSA 0.43    PMH Plavix for coronary stents.           June 2016 interval history  Patient returns and continued management of a left ureteral stone. He underwent left renal ultrasound today. I reviewed all the images. He continues to show some mild to moderate left hydroureteronephrosis. His UA is clear. His prior PSA was 0.43. Serum metabolic evaluation for kidney stones and was normal.    He continues to have some left lower quadrant pain associated with some frequency and urgency. He said no dysuria or gross hematuria. His symptoms are moderate in bothersome. He is out of town week after next and he wants to know what is going on. We talked about repeat CT scan or KUB and he elected to proceed with CT.    Unofficially, CT scan reveals a persistent 3 mm left distal stone with proximal mild to moderate hydroureteronephrosis.    Past Medical History Problems  1. History of Arthritis 2. History of cardiac disorder (Z86.79) 3. History of esophageal reflux (Z87.19) 4. History of hypercholesterolemia (Z86.39) 5. History of hypertension (Z86.79) 6. History of kidney stones (Z61.096)  Surgical History Problems  1. History of Cath  Stent Placement 2. History of Cholecystectomy 3. History of Eye Surgery 4. History of Tonsillectomy  Current Meds 1. Aspirin 81 MG TABS;  Therapy: (Recorded:03Feb2012) to Recorded 2. Bioflex TABS;  Therapy: (Recorded:15Mar2016) to Recorded 3. Fish Oil CAPS;  Therapy: (Recorded:03Feb2012) to Recorded 4. Lipitor 80 MG Oral Tablet;  Therapy: (Recorded:03Feb2012) to Recorded 5. Multi-Vitamin TABS;  Therapy: (Recorded:03Feb2012) to Recorded 6. Ondansetron HCl TABS;  Therapy: (Recorded:15Mar2016) to Recorded 7. Oxycodone-Acetaminophen 5-325 MG Oral Tablet;  Therapy: (Recorded:15Mar2016) to Recorded 8. Plavix 75 MG Oral Tablet;  Therapy: (Recorded:03Feb2012) to Recorded 9. PriLOSEC 20 MG Oral Capsule Delayed Release;  Therapy: (Recorded:03Feb2012) to Recorded 10. Tamsulosin HCl - 0.4 MG Oral Capsule; TAKE ONE CAPSULE BY MOUTH EVERY DAY;   Therapy: 03Feb2012 to (Evaluate:21Jul2013)  Requested for: 22Apr2013; Last   Rx:22Apr2013 Ordered 11. Toprol XL 50 MG Oral Tablet Extended Release 24 Hour;   Therapy: (Recorded:03Feb2012) to Recorded  Allergies Medication  1. No Known Drug Allergies  Family History Problems  1. Family history of Death In The Family Father   age 33 of emphysema 2. Family history of Death In The Family Mother   age 46 of liver cancer 3. Family history of Emphysema : Father 4. Family history of Family Health Status Number Of Children   3 sons 5. Family history of Liver Cancer : Mother  Social History Problems  1. History of  Alcohol Use 2. Caffeine Use   2-3 a day 3. Former smoker [phone]) 4. Marital History - Currently Married 5. Occupation:   DockWorker 6. History of Tobacco Use   smoked 1- 1 1/2 ppd x 15 years  Vitals Vital Signs [Data Includes: Last 1 Day]  Recorded: 06Jun2016 01:59PM  Blood Pressure: 160 / 95 Temperature: 98 F Heart Rate: 72  Physical Exam Constitutional: Well nourished and well developed . No acute distress.   Pulmonary: No respiratory distress and normal respiratory rhythm and effort.  Cardiovascular: Heart rate and rhythm are normal . No peripheral edema.  Neuro/Psych:. Mood and affect are appropriate.    Results/Data Urine [Data Includes: Last 1 Day]   06Jun2016  COLOR YELLOW   APPEARANCE CLEAR   SPECIFIC GRAVITY 1.030   pH 5.5   GLUCOSE NEG mg/dL  BILIRUBIN NEG   KETONE TRACE mg/dL  BLOOD TRACE   PROTEIN NEG mg/dL  UROBILINOGEN 0.2 mg/dL  NITRITE NEG   LEUKOCYTE ESTERASE NEG   SQUAMOUS EPITHELIAL/HPF RARE   WBC NONE SEEN WBC/hpf  RBC 0-2 RBC/hpf  BACTERIA RARE   CRYSTALS Calcium Oxalate crystals noted   CASTS NONE SEEN    Procedure KUB-comparison to prior KUB and CT, findings: The bowel gas pattern appeared normal. The bones appeared normal. No stones over the renal shadows. In the left pelvis there may be a smaller medial calcification with some more inferior stable pelvic phleboliths. I reviewed all the images.     Assessment Assessed  1. Calculus of left ureter (N20.1) 2. Hydronephrosis with renal and ureteral calculus obstruction (N13.2) 3. Bacteriuria, asymptomatic (N39.0)  Plan Bacteriuria, asymptomatic  1. URINE CULTURE; Status:In Progress - Specimen/Data Collected;   Done: 06Jun2016 Calculus of left ureter  2. AU CT-STONE PROTOCOL; Status:In Progress - Specimen/Data Collected;   Done:  06Jun2016 04:09PM Health Maintenance  3. UA With REFLEX; [Do Not Release]; Status:Complete;   Done: 06Jun2016 01:27PM  Discussion/Summary   Left ureteral stone - remains at the left UVJ. He is not on alpha blockers and I gave him some samples of Rapaflo. We discussed the nature risks and benefits of continued surveillance versus left ureteroscopy. He elected to proceed with left ureteroscopy but would like to give it another few weeks.    PCa screening - PSA remains very low.     bacteriuria - I sent urine for Cx as a precaution.       cc: Dr. Holley Bouche      Signatures Electronically signed by : Jerilee Field, M.D.; Mar 30 2015  4:40PM EST    ADD: Urine Cx negative.

## 2015-04-23 ENCOUNTER — Encounter (HOSPITAL_BASED_OUTPATIENT_CLINIC_OR_DEPARTMENT_OTHER): Payer: Self-pay | Admitting: *Deleted

## 2015-04-27 ENCOUNTER — Encounter (HOSPITAL_BASED_OUTPATIENT_CLINIC_OR_DEPARTMENT_OTHER): Payer: Self-pay | Admitting: Anesthesiology

## 2015-04-27 NOTE — Anesthesia Preprocedure Evaluation (Addendum)
Anesthesia Evaluation  Patient identified by MRN, date of birth, ID band Patient awake    Reviewed: Allergy & Precautions, NPO status , Patient's Chart, lab work & pertinent test results  Airway Mallampati: III  TM Distance: >3 FB Neck ROM: Full    Dental no notable dental hx. (+) Teeth Intact, Caps, Dental Advisory Given,    Pulmonary former smoker,  breath sounds clear to auscultation  Pulmonary exam normal       Cardiovascular Exercise Tolerance: Good hypertension, Pt. on medications and Pt. on home beta blockers + angina with exertion + CAD and + Cardiac Stents Normal cardiovascular examRhythm:Regular Rate:Normal  Cardiology office visit 03-30-15 with Dr. [place] reviewed.  2 stents, last was 2004  No cardiac symptoms.   Neuro/Psych negative neurological ROS  negative psych ROS   GI/Hepatic Neg liver ROS, hiatal hernia, GERD-  Medicated and Controlled,  Endo/Other  Morbid obesity  Renal/GU negative Renal ROS  negative genitourinary   Musculoskeletal negative musculoskeletal ROS (+) Arthritis -, Osteoarthritis,    Abdominal (+) + obese,   Peds negative pediatric ROS (+)  Hematology negative hematology ROS (+)   Anesthesia Other Findings   Reproductive/Obstetrics negative OB ROS                      Anesthesia Physical Anesthesia Plan  ASA: III  Anesthesia Plan: General   Post-op Pain Management:    Induction: Intravenous  Airway Management Planned: Oral ETT  Additional Equipment:   Intra-op Plan:   Post-operative Plan: Extubation in OR  Informed Consent: I have reviewed the patients History and Physical, chart, labs and discussed the procedure including the risks, benefits and alternatives for the proposed anesthesia with the patient or authorized representative who has indicated his/her understanding and acceptance.   Dental advisory given  Plan Discussed with:  CRNA  Anesthesia Plan Comments:         Anesthesia Quick Evaluation

## 2015-04-28 ENCOUNTER — Ambulatory Visit (HOSPITAL_BASED_OUTPATIENT_CLINIC_OR_DEPARTMENT_OTHER)
Admission: RE | Admit: 2015-04-28 | Discharge: 2015-04-28 | Disposition: A | Discharge status: Home / Self Care | Payer: Managed Care, Other (non HMO) | Source: Ambulatory Visit | Attending: Urology | Admitting: Urology

## 2015-04-28 ENCOUNTER — Ambulatory Visit (HOSPITAL_BASED_OUTPATIENT_CLINIC_OR_DEPARTMENT_OTHER): Payer: Managed Care, Other (non HMO) | Admitting: Anesthesiology

## 2015-04-28 ENCOUNTER — Encounter (HOSPITAL_BASED_OUTPATIENT_CLINIC_OR_DEPARTMENT_OTHER)
Admission: RE | Disposition: A | Discharge status: Home / Self Care | Payer: Self-pay | Source: Ambulatory Visit | Attending: Urology

## 2015-04-28 ENCOUNTER — Encounter (HOSPITAL_BASED_OUTPATIENT_CLINIC_OR_DEPARTMENT_OTHER): Payer: Self-pay | Admitting: *Deleted

## 2015-04-28 DIAGNOSIS — K219 Gastro-esophageal reflux disease without esophagitis: Secondary | ICD-10-CM | POA: Insufficient documentation

## 2015-04-28 DIAGNOSIS — Z79899 Other long term (current) drug therapy: Secondary | ICD-10-CM | POA: Diagnosis not present

## 2015-04-28 DIAGNOSIS — N132 Hydronephrosis with renal and ureteral calculous obstruction: Secondary | ICD-10-CM | POA: Insufficient documentation

## 2015-04-28 DIAGNOSIS — I10 Essential (primary) hypertension: Secondary | ICD-10-CM | POA: Insufficient documentation

## 2015-04-28 DIAGNOSIS — Z87442 Personal history of urinary calculi: Secondary | ICD-10-CM | POA: Diagnosis not present

## 2015-04-28 DIAGNOSIS — N3020 Other chronic cystitis without hematuria: Secondary | ICD-10-CM | POA: Diagnosis not present

## 2015-04-28 DIAGNOSIS — E780 Pure hypercholesterolemia: Secondary | ICD-10-CM | POA: Diagnosis not present

## 2015-04-28 DIAGNOSIS — Z87891 Personal history of nicotine dependence: Secondary | ICD-10-CM | POA: Insufficient documentation

## 2015-04-28 DIAGNOSIS — Z7982 Long term (current) use of aspirin: Secondary | ICD-10-CM | POA: Diagnosis not present

## 2015-04-28 DIAGNOSIS — M1990 Unspecified osteoarthritis, unspecified site: Secondary | ICD-10-CM | POA: Diagnosis not present

## 2015-04-28 DIAGNOSIS — N201 Calculus of ureter: Secondary | ICD-10-CM | POA: Diagnosis present

## 2015-04-28 HISTORY — PX: CYSTOSCOPY/URETEROSCOPY/HOLMIUM LASER/STENT PLACEMENT: SHX6546

## 2015-04-28 HISTORY — DX: Gastro-esophageal reflux disease without esophagitis: K21.9

## 2015-04-28 HISTORY — DX: Personal history of other diseases of the digestive system: Z87.19

## 2015-04-28 HISTORY — DX: Calculus of ureter: N20.1

## 2015-04-28 LAB — POCT HEMOGLOBIN-HEMACUE: Hemoglobin: 16.5 g/dL (ref 13.0–17.0)

## 2015-04-28 SURGERY — CYSTOSCOPY/URETEROSCOPY/HOLMIUM LASER/STENT PLACEMENT
Anesthesia: General | Site: Ureter | Laterality: Left

## 2015-04-28 MED ORDER — FENTANYL CITRATE (PF) 100 MCG/2ML IJ SOLN
INTRAMUSCULAR | Status: DC | PRN
  Administered 2015-04-28: 100 ug via INTRAVENOUS

## 2015-04-28 MED ORDER — MIDAZOLAM HCL 2 MG/2ML IJ SOLN
INTRAMUSCULAR | Status: AC
  Filled 2015-04-28: qty 2

## 2015-04-28 MED ORDER — CEFAZOLIN SODIUM-DEXTROSE 2-3 GM-% IV SOLR
2.0000 g | INTRAVENOUS | Status: AC
  Administered 2015-04-28: 2 g via INTRAVENOUS
  Filled 2015-04-28: qty 50

## 2015-04-28 MED ORDER — NITROFURANTOIN MONOHYD MACRO 100 MG PO CAPS: 100.0000 mg | ORAL_CAPSULE | Freq: Every day | ORAL | Status: DC

## 2015-04-28 MED ORDER — PROMETHAZINE HCL 25 MG/ML IJ SOLN
6.2500 mg | INTRAMUSCULAR | Status: DC | PRN
  Filled 2015-04-28: qty 1

## 2015-04-28 MED ORDER — DEXAMETHASONE SODIUM PHOSPHATE 4 MG/ML IJ SOLN
INTRAMUSCULAR | Status: DC | PRN
Start: 2015-04-28 — End: 2015-04-28
  Administered 2015-04-28: 10 mg via INTRAVENOUS

## 2015-04-28 MED ORDER — PROPOFOL 10 MG/ML IV BOLUS
INTRAVENOUS | Status: DC | PRN
  Administered 2015-04-28: 250 mg via INTRAVENOUS

## 2015-04-28 MED ORDER — ONDANSETRON HCL 4 MG/2ML IJ SOLN
INTRAMUSCULAR | Status: DC | PRN
  Administered 2015-04-28: 4 mg via INTRAVENOUS

## 2015-04-28 MED ORDER — SUCCINYLCHOLINE CHLORIDE 20 MG/ML IJ SOLN
INTRAMUSCULAR | Status: DC | PRN
  Administered 2015-04-28: 100 mg via INTRAVENOUS

## 2015-04-28 MED ORDER — LACTATED RINGERS IV SOLN
INTRAVENOUS | Status: DC
  Administered 2015-04-28 (×2): via INTRAVENOUS
  Filled 2015-04-28: qty 1000

## 2015-04-28 MED ORDER — FENTANYL CITRATE (PF) 100 MCG/2ML IJ SOLN
25.0000 ug | INTRAMUSCULAR | Status: DC | PRN
  Filled 2015-04-28: qty 1

## 2015-04-28 MED ORDER — MIDAZOLAM HCL 5 MG/5ML IJ SOLN
INTRAMUSCULAR | Status: DC | PRN
  Administered 2015-04-28: 2 mg via INTRAVENOUS

## 2015-04-28 MED ORDER — PHENYLEPHRINE HCL 10 MG/ML IJ SOLN
INTRAMUSCULAR | Status: DC | PRN
  Administered 2015-04-28 (×2): 120 ug via INTRAVENOUS

## 2015-04-28 MED ORDER — KETOROLAC TROMETHAMINE 30 MG/ML IJ SOLN
INTRAMUSCULAR | Status: DC | PRN
  Administered 2015-04-28: 30 mg via INTRAVENOUS

## 2015-04-28 MED ORDER — LIDOCAINE HCL 4 % MT SOLN
OROMUCOSAL | Status: DC | PRN
Start: 2015-04-28 — End: 2015-04-28
  Administered 2015-04-28: 4 mL via TOPICAL

## 2015-04-28 MED ORDER — CEFAZOLIN SODIUM-DEXTROSE 2-3 GM-% IV SOLR
INTRAVENOUS | Status: AC
  Filled 2015-04-28: qty 50

## 2015-04-28 MED ORDER — FENTANYL CITRATE (PF) 100 MCG/2ML IJ SOLN
INTRAMUSCULAR | Status: AC
  Filled 2015-04-28: qty 4

## 2015-04-28 MED ORDER — CEFAZOLIN SODIUM 1-5 GM-% IV SOLN
1.0000 g | INTRAVENOUS | Status: DC
  Filled 2015-04-28: qty 50

## 2015-04-28 MED ORDER — IOHEXOL 350 MG/ML SOLN
INTRAVENOUS | Status: DC | PRN
Start: 2015-04-28 — End: 2015-04-28
  Administered 2015-04-28: 14 mL

## 2015-04-28 MED ORDER — SODIUM CHLORIDE 0.9 % IR SOLN
Status: DC | PRN
  Administered 2015-04-28: 3000 mL
  Administered 2015-04-28: 1000 mL

## 2015-04-28 SURGICAL SUPPLY — 38 items
ADAPTER CATH URET PLST 4-6FR (CATHETERS) IMPLANT
BAG DRAIN URO-CYSTO SKYTR STRL (DRAIN) ×4 IMPLANT
BASKET LASER NITINOL 1.9FR (BASKET) IMPLANT
BASKET STNLS GEMINI 4WIRE 3FR (BASKET) IMPLANT
BASKET ZERO TIP NITINOL 2.4FR (BASKET) IMPLANT
CANISTER SUCT LVC 12 LTR MEDI- (MISCELLANEOUS) IMPLANT
CATH INTERMIT  6FR 70CM (CATHETERS) ×4 IMPLANT
CATH URET 5FR 28IN CONE TIP (BALLOONS)
CATH URET 5FR 28IN OPEN ENDED (CATHETERS) IMPLANT
CATH URET 5FR 70CM CONE TIP (BALLOONS) IMPLANT
CATH URET DUAL LUMEN 6-10FR 50 (CATHETERS) IMPLANT
CLOTH BEACON ORANGE TIMEOUT ST (SAFETY) ×4 IMPLANT
ELECT REM PT RETURN 9FT ADLT (ELECTROSURGICAL)
ELECTRODE REM PT RTRN 9FT ADLT (ELECTROSURGICAL) IMPLANT
GLOVE BIO SURGEON STRL SZ 6.5 (GLOVE) ×3 IMPLANT
GLOVE BIO SURGEON STRL SZ7 (GLOVE) ×4 IMPLANT
GLOVE BIO SURGEON STRL SZ7.5 (GLOVE) ×4 IMPLANT
GLOVE BIO SURGEONS STRL SZ 6.5 (GLOVE) ×1
GLOVE INDICATOR 6.5 STRL GRN (GLOVE) ×4 IMPLANT
GLOVE INDICATOR 7.0 STRL GRN (GLOVE) ×4 IMPLANT
GOWN STRL REUS W/ TWL LRG LVL3 (GOWN DISPOSABLE) ×4 IMPLANT
GOWN STRL REUS W/ TWL XL LVL3 (GOWN DISPOSABLE) ×2 IMPLANT
GOWN STRL REUS W/TWL LRG LVL3 (GOWN DISPOSABLE) ×4
GOWN STRL REUS W/TWL XL LVL3 (GOWN DISPOSABLE) ×2
GUIDEWIRE 0.038 PTFE COATED (WIRE) IMPLANT
GUIDEWIRE ANG ZIPWIRE 038X150 (WIRE) IMPLANT
GUIDEWIRE STR DUAL SENSOR (WIRE) ×4 IMPLANT
IV NS 1000ML (IV SOLUTION) ×2
IV NS 1000ML BAXH (IV SOLUTION) ×2 IMPLANT
IV NS IRRIG 3000ML ARTHROMATIC (IV SOLUTION) ×4 IMPLANT
KIT BALLIN UROMAX 15FX10 (LABEL) IMPLANT
KIT BALLN UROMAX 15FX4 (MISCELLANEOUS) IMPLANT
KIT BALLN UROMAX 26 75X4 (MISCELLANEOUS)
MANIFOLD NEPTUNE II (INSTRUMENTS) ×4 IMPLANT
PACK CYSTO (CUSTOM PROCEDURE TRAY) ×4 IMPLANT
SET HIGH PRES BAL DIL (LABEL)
SHEATH ACCESS URETERAL 38CM (SHEATH) IMPLANT
SYRINGE IRR TOOMEY STRL 70CC (SYRINGE) IMPLANT

## 2015-04-28 NOTE — Interval H&P Note (Signed)
History and Physical Interval Note:  04/28/2015 7:26 AM  Tim Heath  has presented today for surgery, with the diagnosis of LEFT URETERAL STONE  The various methods of treatment have been discussed with the patient and family. After consideration of risks, benefits and other options for treatment, the patient has consented to  Procedure(s): CYSTOSCOPY/LEFT RETROGRADE LEFT URETEROSCOPY/HOLMIUM LASER/STENT PLACEMENT (Left) HOLMIUM LASER APPLICATION (Left) as a surgical intervention .  The patient's history has been reviewed, patient examined, no change in status, stable for surgery.  I have reviewed the patient's chart and labs. He has not seen a stone pass and continues to have some LLQ discomfort and urgency/pressure to void. No dysuria or fever. I discussed with the patient the nature, potential benefits, risks and alternatives to cysto, left RGP, left URS/HLL/stent, including side effects of the proposed treatment, the likelihood of the patient achieving the goals of the procedure, and any potential problems that might occur during the procedure or recuperation. All questions answered. Patient elects to proceed. Discussed he may have passed stone and need for staged procedures.      [name]

## 2015-04-28 NOTE — Transfer of Care (Signed)
Immediate Anesthesia Transfer of Care Note  Patient: Tim Heath  Procedure(s) Performed: Procedure(s): CYSTOSCOPY/LEFT RETROGRADE LEFT URETEROSCOPY/STENT PLACEMENT/BLADDER BIOPSY WITH FULGERATION (Left)  Patient Location: PACU  Anesthesia Type:General  Level of Consciousness: awake, alert , oriented and patient cooperative  Airway & Oxygen Therapy: Patient Spontanous Breathing and Patient connected to nasal cannula oxygen  Post-op Assessment: Report given to RN and Post -op Vital signs reviewed and stable  Post vital signs: Reviewed and stable  Last Vitals:  Filed Vitals:   04/28/15 0615  BP: 159/96  Pulse: 73  Temp: 37.1 C  Resp: 18    Complications: No apparent anesthesia complications

## 2015-04-28 NOTE — Anesthesia Procedure Notes (Signed)
Procedure Name: Intubation Date/Time: 04/28/2015 7:32 AM Performed by: Wanita Chamberlain Pre-anesthesia Checklist: Patient identified, Timeout performed, Emergency Drugs available, Suction available and Patient being monitored Patient Re-evaluated:Patient Re-evaluated prior to inductionOxygen Delivery Method: Circle system utilized Preoxygenation: Pre-oxygenation with 100% oxygen Intubation Type: IV induction Ventilation: Mask ventilation without difficulty Laryngoscope Size: Mac and 3 Grade View: Grade III Tube type: Oral Number of attempts: 1 Airway Equipment and Method: Patient positioned with wedge pillow,  Stylet,  Bite block,  LTA kit utilized and Oral airway Placement Confirmation: ETT inserted through vocal cords under direct vision,  positive ETCO2 and breath sounds checked- equal and bilateral Secured at: 22 cm Tube secured with: Tape Dental Injury: Teeth and Oropharynx as per pre-operative assessment  Difficulty Due To: Difficulty was anticipated Future Recommendations: Recommend- induction with short-acting agent, and alternative techniques readily available

## 2015-04-28 NOTE — Anesthesia Postprocedure Evaluation (Signed)
  Anesthesia Post-op Note  Patient: Tim Heath  Procedure(s) Performed: Procedure(s) (LRB): CYSTOSCOPY/LEFT RETROGRADE LEFT URETEROSCOPY/STENT PLACEMENT/BLADDER BIOPSY WITH FULGERATION (Left)  Patient Location: PACU  Anesthesia Type: General  Level of Consciousness: awake and alert   Airway and Oxygen Therapy: Patient Spontanous Breathing  Post-op Pain: mild  Post-op Assessment: Post-op Vital signs reviewed, Patient's Cardiovascular Status Stable, Respiratory Function Stable, Patent Airway and No signs of Nausea or vomiting  Last Vitals:  Filed Vitals:   04/28/15 0900  BP: 130/63  Pulse: 64  Temp:   Resp: 21    Post-op Vital Signs: stable   Complications: No apparent anesthesia complications

## 2015-04-28 NOTE — Discharge Instructions (Signed)
Ureteral Stent Implantation, Care After Refer to this sheet in the next few weeks. These instructions provide you with information on caring for yourself after your procedure. Your health care provider may also give you more specific instructions. Your treatment has been planned according to current medical practices, but problems sometimes occur. Call your health care provider if you have any problems or questions after your procedure. WHAT TO EXPECT AFTER THE PROCEDURE You should be back to normal activity within 48 hours after the procedure. Nausea and vomiting may occur and are commonly the result of anesthesia. It is common to experience sharp pain in the back or lower abdomen and penis with voiding. This is caused by movement of the ends of the stent with the act of urinating.It usually goes away within minutes after you have stopped urinating. HOME CARE INSTRUCTIONS Make sure to drink plenty of fluids. You may have small amounts of bleeding, causing your urine to be red. This is normal. Certain movements may trigger pain or a feeling that you need to urinate. You may be given medicines to prevent infection or bladder spasms. Be sure to take all medicines as directed. Only take over-the-counter or prescription medicines for pain, discomfort, or fever as directed by your health care provider. Do not take aspirin, as this can make bleeding worse.  REMOVAL OF THE STENT: Remove the stent by pulling the string with slow steady pressure on Friday morning, 05/01/2015.  Be sure to keep all follow-up appointments so your health care provider can check that you are healing properly.  SEEK MEDICAL CARE IF:  You experience increasing pain.  Your pain medicine is not working. SEEK IMMEDIATE MEDICAL CARE IF:  Your urine is dark red or has blood clots.  You are leaking urine (incontinent).  You have a fever, chills, feeling sick to your stomach (nausea), or vomiting.  Your pain is not relieved by  pain medicine.  The end of the stent comes out of the urethra.  You are unable to urinate. Document Released: 06/12/2013 Document Revised: 10/15/2013 Document Reviewed: 06/12/2013 [facility] Patient Information 2015 [place], [place]. This information is not intended to replace advice given to you by your health care provider. Make sure you discuss any questions you have with your health care provider.   Post Anesthesia Home Care Instructions  Activity: Get plenty of rest for the remainder of the day. A responsible adult should stay with you for 24 hours following the procedure.  For the next 24 hours, DO NOT: -Drive a car -Advertising copywriter -Drink alcoholic beverages -Take any medication unless instructed by your physician -Make any legal decisions or sign important papers.  Meals: Start with liquid foods such as gelatin or soup. Progress to regular foods as tolerated. Avoid greasy, spicy, heavy foods. If nausea and/or vomiting occur, drink only clear liquids until the nausea and/or vomiting subsides. Call your physician if vomiting continues.  Special Instructions/Symptoms: Your throat may feel dry or sore from the anesthesia or the breathing tube placed in your throat during surgery. If this causes discomfort, gargle with warm salt water. The discomfort should disappear within 24 hours.  If you had a scopolamine patch placed behind your ear for the management of post- operative nausea and/or vomiting:  1. The medication in the patch is effective for 72 hours, after which it should be removed.  Wrap patch in a tissue and discard in the trash. Wash hands thoroughly with soap and water. 2. You may remove the patch earlier than  72 hours if you experience unpleasant side effects which may include dry mouth, dizziness or visual disturbances. 3. Avoid touching the patch. Wash your hands with soap and water after contact with the patch.

## 2015-04-28 NOTE — Op Note (Signed)
Preoperative diagnosis: Left ureteral stone, left hydronephrosis Postoperative diagnosis: Bladder erythema  Procedure: Cystoscopy, left retrograde pyelogram, left ureteroscopy, Bladder biopsy and fulguration, left ureteral stent placement, exam under anesthesia  Surgeon: Adiva Boettner  Type of anesthesia: Gen.  Indication for procedure: Patient is a 54 year old male who had a 2-3 mm stone which was symptomatic. It was slow to progress from March through June. He continues to have left lower quadrant pain and discomfort. Also periods of urgency. Is not seen the stone pass. He was brought to the operating room for cystoscopy with left retrograde left ureteroscopy and definitive stone management. We discussed it was possible he already past the stone as his stone was small and he could pass it from the bladder without noticing it.  Findings: On cystoscopy the urethra and the prostate unremarkable. The trigone and ureteral orifices were in their normal orthotopic position. There is clear efflux from the right ureteral orifice. There was some erythema around the left ureteral orifice and more significant erythema and almost submucosal hemorrhage lateral to the left ureteral orifice. There were no stones or foreign bodies in the bladder. The remainder of the bladder mucosa was unremarkable. There were no obvious tumors. I did biopsy the erythema as it seemed a bit out of the ordinary for a small stone but possibly the resolving inflammation after he passed the stone is what has been giving him symptoms. I wanted to be certain there was no other process going on such as CIS which was causing his symptoms since the stone was not located.  Left retrograde pyelogram-this outlined a single ureter single collecting system unit without obvious filling defect, stricture or dilation. There was some narrowing at the ureterovesical junction and it was difficult to determine if this was then normal ureterovesical junction  or possible small stone was in this area. Given his continued symptoms I fell ureteroscopy was indicated.  Ureteroscopy was essentially unremarkable. There was some narrowing at the ureterovesical junction which the scope dilated but there were no stones. I was able to visualize all the way up to the ureteropelvic junction. The collecting system retrograde was normal therefore I did not feel like direct visualization of the collecting system with a flexible ureteroscope was necessary.  On exam under anesthesia the penis was circumcised and without mass or lesion. The testicles were descended bilaterally and palpably normal. On digital rectal exam the prostate was smooth and benign. Landmarks were preserved. There were no hard areas or nodules.  Prescription of procedure: After consent was obtained patient brought the operating room. After adequate anesthesia he is placed in lithotomy position and prepped and draped in the usual sterile fashion. A timeout was performed to confirm the patient and procedure. The cystoscope was passed per urethra and the bladder inspected. The left ureteral orifice was cannulated with a 6 [place] open-ended catheter and left retrograde injection of contrast was performed. I was not able to get the 6 [place] open-ended catheter into the ureter but only in the opening of the ureteral orifice. This was also while I thought there might be a small stone in the system. There was brisk drainage of contrast. A sensor wire was advanced and coiled in the upper pole collecting system. The bladder drained and the scope removed. The semirigid ureteroscope was then advanced all the way up to the proximal ureter with visualization up to the UPJ. No stones were noted. On exit, the ureter was noted to be normal without stone or injury. At the ureterovesical  junction and there had been some dilation of a narrow area. There was some mild hematuria therefore thought it was best to leave a stent.  The  cystoscope was then repassed and I used a 7 [place] flexible biopsy forceps to biopsy the laterally erythema. This was lightly fulgurated with excellent hemostasis. This was sent as left bladder biopsy.  The wire was then backloaded on the cystoscope and a 6 x 26 and a meter stent advance. The wire was removed with a good coil reconstituting in the bladder and a good coil in the collecting system. The bladder was drained and the scope removed. I left the string on the stent.  The patient was awakened taken to recovery room in stable condition.  Complications: None  Blood loss: Minimal  Drains: 6 x 26 cm left ureteral stent with string  Specimens to pathology: Left bladder biopsy 1

## 2015-04-29 ENCOUNTER — Encounter (HOSPITAL_BASED_OUTPATIENT_CLINIC_OR_DEPARTMENT_OTHER): Payer: Self-pay | Admitting: Urology

## 2015-05-08 ENCOUNTER — Telehealth: Payer: Self-pay | Admitting: Cardiology

## 2015-05-08 ENCOUNTER — Other Ambulatory Visit: Payer: Self-pay | Admitting: *Deleted

## 2015-05-08 MED ORDER — METOPROLOL SUCCINATE ER 50 MG PO TB24: 50.0000 mg | ORAL_TABLET | Freq: Every day | ORAL | Status: DC

## 2015-05-08 MED ORDER — CLOPIDOGREL BISULFATE 75 MG PO TABS: 75.0000 mg | ORAL_TABLET | Freq: Every day | ORAL | Status: DC

## 2015-05-08 MED ORDER — ATORVASTATIN CALCIUM 80 MG PO TABS: 80.0000 mg | ORAL_TABLET | Freq: Every day | ORAL | Status: DC

## 2015-05-08 NOTE — Telephone Encounter (Signed)
°  1. Which medications need to be refilled?  Atorvastatin,Metoprolol, and Clopidogrel-please send today if possible  2. Which pharmacy is medication to be sent to?[facility] 3. need a 30 day or 90 day supply? 90 and refills 4. Would they like a call back once the medication has been sent to the pharmacy? no

## 2015-07-30 ENCOUNTER — Telehealth: Payer: Self-pay | Admitting: Cardiology

## 2015-08-03 NOTE — Telephone Encounter (Signed)
Close encounter 

## 2015-11-16 ENCOUNTER — Telehealth: Payer: Self-pay | Admitting: Cardiology

## 2015-11-16 ENCOUNTER — Other Ambulatory Visit: Payer: Self-pay | Admitting: Cardiology

## 2015-11-16 MED ORDER — METOPROLOL SUCCINATE ER 50 MG PO TB24: 50.0000 mg | ORAL_TABLET | Freq: Every day | ORAL | Status: DC

## 2015-11-16 MED ORDER — ATORVASTATIN CALCIUM 80 MG PO TABS: 80.0000 mg | ORAL_TABLET | Freq: Every day | ORAL | Status: DC

## 2015-11-16 MED ORDER — CLOPIDOGREL BISULFATE 75 MG PO TABS: 75.0000 mg | ORAL_TABLET | Freq: Every day | ORAL | Status: DC

## 2015-11-16 NOTE — Telephone Encounter (Signed)
Refill sent to the pharmacy electronically.  

## 2015-11-16 NOTE — Telephone Encounter (Signed)
°*  STAT* If patient is at the pharmacy, call can be transferred to refill team.   1. Which medications need to be refilled? (please list name of each medication and dose if known) Lipitor  , Plavix  and Metoprolol XL   All Generic    2. Which pharmacy/location (including street and city if local pharmacy) is medication to be sent to?[facility] on [address]   3. Do they need a 30 day or 90 day supply? 90  Patient is changing pharmacy and need all new prescriptions sent over .   Thanks

## 2015-11-17 ENCOUNTER — Other Ambulatory Visit: Payer: Self-pay | Admitting: Cardiology

## 2015-11-18 NOTE — Telephone Encounter (Signed)
Rx request sent to pharmacy.  

## 2015-11-23 ENCOUNTER — Encounter: Payer: Self-pay | Admitting: Cardiology

## 2015-11-23 ENCOUNTER — Ambulatory Visit (INDEPENDENT_AMBULATORY_CARE_PROVIDER_SITE_OTHER): Payer: Managed Care, Other (non HMO) | Admitting: Cardiology

## 2015-11-23 VITALS — BP 124/90 | HR 86 | Ht 68.0 in | Wt 293.0 lb

## 2015-11-23 DIAGNOSIS — E785 Hyperlipidemia, unspecified: Secondary | ICD-10-CM

## 2015-11-23 DIAGNOSIS — I10 Essential (primary) hypertension: Secondary | ICD-10-CM | POA: Diagnosis not present

## 2015-11-23 DIAGNOSIS — I2510 Atherosclerotic heart disease of native coronary artery without angina pectoris: Secondary | ICD-10-CM | POA: Diagnosis not present

## 2015-11-23 LAB — BASIC METABOLIC PANEL
BUN: 17 mg/dL (ref 7–25)
CHLORIDE: 106 mmol/L (ref 98–110)
CO2: 29 mmol/L (ref 20–31)
CREATININE: 0.63 mg/dL — AB (ref 0.70–1.33)
Calcium: 8.8 mg/dL (ref 8.6–10.3)
Glucose, Bld: 109 mg/dL — ABNORMAL HIGH (ref 65–99)
Potassium: 4.4 mmol/L (ref 3.5–5.3)
Sodium: 142 mmol/L (ref 135–146)

## 2015-11-23 LAB — CBC WITH DIFFERENTIAL/PLATELET
BASOS PCT: 0 % (ref 0–1)
Basophils Absolute: 0 10*3/uL (ref 0.0–0.1)
Eosinophils Absolute: 0.1 10*3/uL (ref 0.0–0.7)
Eosinophils Relative: 1 % (ref 0–5)
HCT: 45.3 % (ref 39.0–52.0)
Hemoglobin: 15.4 g/dL (ref 13.0–17.0)
LYMPHS ABS: 1.7 10*3/uL (ref 0.7–4.0)
Lymphocytes Relative: 29 % (ref 12–46)
MCH: 30.9 pg (ref 26.0–34.0)
MCHC: 34 g/dL (ref 30.0–36.0)
MCV: 90.8 fL (ref 78.0–100.0)
MONOS PCT: 9 % (ref 3–12)
MPV: 11.3 fL (ref 8.6–12.4)
Monocytes Absolute: 0.5 10*3/uL (ref 0.1–1.0)
NEUTROS PCT: 61 % (ref 43–77)
Neutro Abs: 3.5 10*3/uL (ref 1.7–7.7)
PLATELETS: 204 10*3/uL (ref 150–400)
RBC: 4.99 MIL/uL (ref 4.22–5.81)
RDW: 13.1 % (ref 11.5–15.5)
WBC: 5.8 10*3/uL (ref 4.0–10.5)

## 2015-11-23 LAB — LIPID PANEL
Cholesterol: 118 mg/dL — ABNORMAL LOW (ref 125–200)
HDL: 35 mg/dL — ABNORMAL LOW (ref >=40)
LDL CALC: 74 mg/dL (ref <130)
TRIGLYCERIDES: 47 mg/dL (ref <150)
Total CHOL/HDL Ratio: 3.4 Ratio (ref <=5.0)
VLDL: 9 mg/dL (ref <30)

## 2015-11-23 LAB — HEPATIC FUNCTION PANEL
ALBUMIN: 3.7 g/dL (ref 3.6–5.1)
ALK PHOS: 81 U/L (ref 40–115)
ALT: 51 U/L — ABNORMAL HIGH (ref 9–46)
AST: 31 U/L (ref 10–35)
BILIRUBIN INDIRECT: 0.5 mg/dL (ref 0.2–1.2)
Bilirubin, Direct: 0.2 mg/dL (ref <=0.2)
TOTAL PROTEIN: 5.9 g/dL — AB (ref 6.1–8.1)
Total Bilirubin: 0.7 mg/dL (ref 0.2–1.2)

## 2015-11-23 NOTE — Progress Notes (Signed)
[name] Date of Birth: 10-May-1961   History of Present Illness: Tim Heath is seen today for followup of CAD. He has a history of coronary disease with stenting of the right coronary in 2003 with a Cypher stent. He had stenting of the ramus intermediate branch in 2004 with a Taxus stent. Cardiac catheterization in March 2014 showed nonobstructive disease. On follow up today he is having no chest pain or SOB. He complains of some sinus HA. He has gained 6 lbs. He does not walk as much as he used to. He did undergo kidney stone extraction in September.   Current Outpatient Prescriptions on File Prior to Visit  Medication Sig Dispense Refill  . Acetaminophen (TYLENOL ARTHRITIS PAIN PO) Take by mouth 2 (two) times daily.      Marland Kitchen aspirin 81 MG tablet Take 81 mg by mouth daily.      Marland Kitchen atorvastatin (LIPITOR) 80 MG tablet Take 1 tablet (80 mg total) by mouth daily. 90 tablet 2  . clopidogrel (PLAVIX) 75 MG tablet Take 1 tablet (75 mg total) by mouth daily. 90 tablet 2  . lisinopril (PRINIVIL,ZESTRIL) 20 MG tablet TAKE ONE TABLET BY MOUTH DAILY 30 tablet 0  . metoprolol succinate (TOPROL-XL) 50 MG 24 hr tablet Take 1 tablet (50 mg total) by mouth daily. 90 tablet 2  . Multiple Vitamin (MULTIVITAMIN) tablet Take 1 tablet by mouth daily.      . nitroGLYCERIN (NITROSTAT) 0.4 MG SL tablet Place 1 tablet (0.4 mg total) under the tongue every 5 (five) minutes as needed for chest pain. 100 tablet 6  . omeprazole (PRILOSEC) 20 MG capsule Take 1 capsule (20 mg total) by mouth daily. 90 capsule 3  . tamsulosin (FLOMAX) 0.4 MG CAPS capsule Take 1 capsule (0.4 mg total) by mouth daily. 10 capsule 0   No current facility-administered medications on file prior to visit.    No Known Allergies  Past Medical History  Diagnosis Date  . Hypertension   . Hyperlipidemia   . History of Bell's palsy   . Coronary artery disease cardiologist-  dr [place]    DESx1 RCA 2003, DES x1  intermediate 2004/ normal LVF  .  Left ureteral stone   . GERD (gastroesophageal reflux disease)   . History of hiatal hernia     Past Surgical History  Procedure Laterality Date  . Tonsillectomy    . Laparoscopic cholecystectomy  04-01-2003  . Coronary angioplasty with stent placement  07-30-2002  dr [place]    abnormal cardiolite (inferior wall ischemia)  single-vessel CAD ,  balloon dilation and DES to RCA/  mild disease LAD 30-40%, non-obstructive/  perserved LV, ef 65%  . Coronary angioplasty with stent placement  05-16-2003  dr [place]    DES x1 to Intermediate branch (90%)/  pLAD 40-50%,  mD1  50%,  patent RCA stent/  normal LVF  . Cardiac catheterization  04-12-2004  &  01-18-2013  dr [place]    Non-obstructive CAD/  continued patency of stents fo RCA and Intermediate branch/  nomral LVF, ef 55-65%  . Cardiovascular stress test  last one 06-06-2011   dr [place]    normal perfusion study/  no ischemia or scar/  normal LV function and wall motion, ef 66%  . Cystoscopy/ureteroscopy/holmium laser/stent placement Left 04/28/2015    Procedure: CYSTOSCOPY/LEFT RETROGRADE LEFT URETEROSCOPY/STENT PLACEMENT/BLADDER BIOPSY WITH FULGERATION;  Surgeon: Jerilee Field, MD;  Location: [facility];  Service: Urology;  Laterality: Left;    History  Smoking status  . Former Smoker  . Quit date: 03/29/1986  Smokeless tobacco  . Not on file    History  Alcohol Use No    Family History  Problem Relation Age of Onset  . Liver cancer Mother   . Emphysema Father   . Hypertension Brother   . Coronary artery disease Brother     Review of Systems: As noted in history of present illness. All other systems were reviewed and are negative.  Physical Exam: BP 124/90 mmHg  Pulse 86  Ht  (1.727 m)  Wt 132.904 kg (293 lb)  BMI 44.56 kg/m2 He is an obese white male in no acute distress. HEENT is normal. Neck is supple no JVD, adenopathy, thyromegaly, or bruits. Lungs are clear. Cardiac exam reveals a regular  rate and rhythm without gallop, murmur, or click. Abdomen is obese, soft, nontender. He has trace edema. Pedal pulses are good. Skin is warm and dry. He is alert and oriented x3. Cranial nerves II through XII are intact. There is no facial droop.  LABORATORY DATA:   Lab Results  Component Value Date   WBC 12.8* 01/03/2015   HGB 16.5 04/28/2015   HCT 46.6 01/03/2015   PLT 198 01/03/2015   GLUCOSE 83 04/08/2015   CHOL 122 03/27/2014   TRIG 45.0 03/27/2014   HDL 33.00* 03/27/2014   LDLCALC 80 03/27/2014   ALT 51 07/01/2014   AST 42* 07/01/2014   NA 141 04/08/2015   K 4.2 04/08/2015   CL 104 04/08/2015   CREATININE 0.65 04/08/2015   BUN 14 04/08/2015   CO2 30 04/08/2015   INR 1.0 01/17/2013   HGBA1C 6.0 05/20/2011   Ecg today shows NSR with rate 86. Normal. I have personally reviewed and interpreted this study.  Assessment / Plan: 1. Coronary disease status post stenting procedures as noted above.  Cardiac catheterization in March 2014 showed nonobstructive disease. He is currently asymptomatic. Continue risk factor modification.  2. Morbid obesity. Encouraged continued weight loss. Needs to increase aerobic activity.  3. Hyperlipidemia. On high-dose statin therapy.   4. Hypertension-BP is controlled today.

## 2015-11-23 NOTE — Patient Instructions (Signed)
We will check lab work today  Continue your current therapy  I will see you in 6 months.   

## 2015-12-14 ENCOUNTER — Other Ambulatory Visit: Payer: Self-pay | Admitting: Cardiology

## 2015-12-14 NOTE — Telephone Encounter (Signed)
Rx refill sent to pharmacy. 

## 2016-04-14 ENCOUNTER — Other Ambulatory Visit: Payer: Self-pay | Admitting: Physician Assistant

## 2016-04-14 ENCOUNTER — Emergency Department (HOSPITAL_COMMUNITY)
Admission: EM | Admit: 2016-04-14 | Discharge: 2016-04-14 | Disposition: A | Discharge status: Home / Self Care | Payer: Managed Care, Other (non HMO) | Attending: Emergency Medicine | Admitting: Emergency Medicine

## 2016-04-14 ENCOUNTER — Encounter (HOSPITAL_COMMUNITY): Payer: Self-pay | Admitting: Emergency Medicine

## 2016-04-14 ENCOUNTER — Other Ambulatory Visit: Payer: Self-pay

## 2016-04-14 ENCOUNTER — Emergency Department (HOSPITAL_COMMUNITY): Payer: Managed Care, Other (non HMO)

## 2016-04-14 DIAGNOSIS — R202 Paresthesia of skin: Secondary | ICD-10-CM | POA: Diagnosis not present

## 2016-04-14 DIAGNOSIS — R079 Chest pain, unspecified: Secondary | ICD-10-CM

## 2016-04-14 DIAGNOSIS — M25512 Pain in left shoulder: Secondary | ICD-10-CM | POA: Insufficient documentation

## 2016-04-14 DIAGNOSIS — E669 Obesity, unspecified: Secondary | ICD-10-CM

## 2016-04-14 DIAGNOSIS — R200 Anesthesia of skin: Secondary | ICD-10-CM

## 2016-04-14 DIAGNOSIS — Z87891 Personal history of nicotine dependence: Secondary | ICD-10-CM | POA: Insufficient documentation

## 2016-04-14 DIAGNOSIS — I25118 Atherosclerotic heart disease of native coronary artery with other forms of angina pectoris: Secondary | ICD-10-CM

## 2016-04-14 DIAGNOSIS — Z79899 Other long term (current) drug therapy: Secondary | ICD-10-CM | POA: Diagnosis not present

## 2016-04-14 DIAGNOSIS — R0789 Other chest pain: Secondary | ICD-10-CM | POA: Diagnosis not present

## 2016-04-14 DIAGNOSIS — I2510 Atherosclerotic heart disease of native coronary artery without angina pectoris: Secondary | ICD-10-CM | POA: Insufficient documentation

## 2016-04-14 DIAGNOSIS — Z7982 Long term (current) use of aspirin: Secondary | ICD-10-CM | POA: Diagnosis not present

## 2016-04-14 DIAGNOSIS — E785 Hyperlipidemia, unspecified: Secondary | ICD-10-CM

## 2016-04-14 DIAGNOSIS — I10 Essential (primary) hypertension: Secondary | ICD-10-CM | POA: Diagnosis not present

## 2016-04-14 LAB — CBC
HCT: 48.1 % (ref 39.0–52.0)
HEMOGLOBIN: 15.9 g/dL (ref 13.0–17.0)
MCH: 31.1 pg (ref 26.0–34.0)
MCHC: 33.1 g/dL (ref 30.0–36.0)
MCV: 93.9 fL (ref 78.0–100.0)
PLATELETS: 193 10*3/uL (ref 150–400)
RBC: 5.12 MIL/uL (ref 4.22–5.81)
RDW: 12.9 % (ref 11.5–15.5)
WBC: 6.8 10*3/uL (ref 4.0–10.5)

## 2016-04-14 LAB — I-STAT TROPONIN, ED
TROPONIN I, POC: 0 ng/mL (ref 0.00–0.08)
Troponin i, poc: 0 ng/mL (ref 0.00–0.08)

## 2016-04-14 LAB — BASIC METABOLIC PANEL
Anion gap: 9 (ref 5–15)
BUN: 11 mg/dL (ref 6–20)
CHLORIDE: 106 mmol/L (ref 101–111)
CO2: 25 mmol/L (ref 22–32)
CREATININE: 0.71 mg/dL (ref 0.61–1.24)
Calcium: 9.4 mg/dL (ref 8.9–10.3)
GFR calc non Af Amer: >60 mL/min (ref >60)
GLUCOSE: 141 mg/dL — AB (ref 65–99)
Potassium: 4 mmol/L (ref 3.5–5.1)
Sodium: 140 mmol/L (ref 135–145)

## 2016-04-14 MED ORDER — NITROGLYCERIN 0.4 MG SL SUBL: 0.4000 mg | SUBLINGUAL_TABLET | SUBLINGUAL | Status: DC | PRN

## 2016-04-14 NOTE — ED Provider Notes (Signed)
CSN: [id number]     Arrival date & time 04/14/16  0730 History   First MD Initiated Contact with Patient 04/14/16 [phone]     Chief Complaint  Patient presents with  . Chest Pain     (Consider location/radiation/quality/duration/timing/severity/associated sxs/prior Treatment) HPI   Patient is a 55 year old male with a history of cardiac stents in 2003 who presents the ED with left shoulder pain and tingling into his left forearm and hand since this morning. Patient also endorses intermittent chest heaviness for the past week. Patient states the pain in the shoulder and tingling in hands is similar to what he felt before he had stents placed. Pain and tingling is intermittent, mild, with associated nausea. He denies dizziness, headache, fever, cough, recent illness, SOB.  Past Medical History  Diagnosis Date  . Hypertension   . Hyperlipidemia   . History of Bell's palsy   . Coronary artery disease cardiologist-  dr [place]andjordan    DESx1 RCA 2003, DES x1  intermediate 2004/ normal LVF  . Left ureteral stone   . GERD (gastroesophageal reflux disease)   . History of hiatal hernia    Past Surgical History  Procedure Laterality Date  . Tonsillectomy    . Laparoscopic cholecystectomy  04-01-2003  . Coronary angioplasty with stent placement  07-30-2002  dr [place]ordan    abnormal cardiolite (inferior wall ischemia)  single-vessel CAD ,  balloon dilation and DES to RCA/  mild disease LAD 30-40%, non-obstructive/  perserved LV, ef 65%  . Coronary angioplasty with stent placement  05-16-2003  dr [place]andjordan    DES x1 to Intermediate branch (90%)/  pLAD 40-50%,  mD1  50%,  patent RCA stent/  normal LVF  . Cardiac catheterization  04-12-2004  &  01-18-2013  dr [place]andjordan    Non-obstructive CAD/  continued patency of stents fo RCA and Intermediate branch/  nomral LVF, ef 55-65%  . Cardiovascular stress test  last one 06-06-2011   dr [place]ordan    normal perfusion study/  no ischemia or scar/  normal LV function and wall  motion, ef 66%  . Cystoscopy/ureteroscopy/holmium laser/stent placement Left 04/28/2015    Procedure: CYSTOSCOPY/LEFT RETROGRADE LEFT URETEROSCOPY/STENT PLACEMENT/BLADDER BIOPSY WITH FULGERATION;  Surgeon: Jerilee FieldMatthew Eskridge, MD;  [facility];  Service: Urology;  Laterality: Left;   Family History  Problem Relation Age of Onset  . Liver cancer Mother   . Emphysema Father   . Hypertension Brother   . Coronary artery disease Brother    Social History  Substance Use Topics  . Smoking status: Former Smoker    Quit date: 03/29/1986  . Smokeless tobacco: None  . Alcohol Use: No    Review of Systems  Constitutional: Negative for fever and chills.  HENT: Negative for trouble swallowing and voice change.   Eyes: Negative for visual disturbance.  Respiratory: Positive for chest tightness. Negative for cough and shortness of breath.   Cardiovascular: Negative for chest pain.  Gastrointestinal: Positive for nausea. Negative for vomiting, abdominal pain, diarrhea and blood in stool.  Musculoskeletal: Positive for arthralgias. Negative for neck pain and neck stiffness.  Skin: Negative for rash.  Allergic/Immunologic: Negative for immunocompromised state.  Neurological: Positive for numbness. Negative for dizziness, syncope, speech difficulty, weakness and headaches.      Allergies  Review of patient's allergies indicates no known allergies.  Home Medications   Prior to Admission medications   Medication Sig Start Date End Date Taking? Authorizing Provider  Acetaminophen (TYLENOL ARTHRITIS PAIN PO)  Take 650 mg by mouth 2 (two) times daily as needed (pain).    Yes Historical Provider, MD  aspirin 81 MG tablet Take 81 mg by mouth daily.     Yes Historical Provider, MD  atorvastatin (LIPITOR) 80 MG tablet Take 1 tablet (80 mg total) by mouth daily. 11/16/15  Yes Peter M [place], MD  clopidogrel (PLAVIX) 75 MG tablet Take 1 tablet (75 mg total) by mouth daily. 11/16/15  Yes  Peter M [place], MD  lisinopril (PRINIVIL,ZESTRIL) 20 MG tablet TAKE ONE TABLET BY MOUTH ONCE DAILY 12/14/15  Yes Peter M [place], MD  metoprolol succinate (TOPROL-XL) 50 MG 24 hr tablet Take 1 tablet (50 mg total) by mouth daily. 11/16/15  Yes Peter M [place], MD  Multiple Vitamin (MULTIVITAMIN) tablet Take 1 tablet by mouth daily.     Yes Historical Provider, MD  omeprazole (PRILOSEC) 20 MG capsule Take 1 capsule (20 mg total) by mouth daily. 03/27/14  Yes Peter M [place], MD  nitroGLYCERIN (NITROSTAT) 0.4 MG SL tablet Place 1 tablet (0.4 mg total) under the tongue every 5 (five) minutes as needed for chest pain. 04/14/16   Jerre Simon, PA  tamsulosin (FLOMAX) 0.4 MG CAPS capsule Take 1 capsule (0.4 mg total) by mouth daily. Patient not taking: Reported on 04/14/2016 01/03/15   Victorino Dike Piepenbrink, PA-C   BP 122/77 mmHg  Pulse 63  Temp(Src) 98.8 F (37.1 C) (Oral)  Resp 22  Ht  (1.727 m)  Wt 136.079 kg  BMI 45.63 kg/m2  SpO2 96% Physical Exam  Constitutional: He appears well-developed and well-nourished.  Non-toxic appearance. He does not appear ill. No distress.  HENT:  Head: Normocephalic and atraumatic.  Eyes: Conjunctivae are normal.  Neck: Trachea normal, normal range of motion and full passive range of motion without pain. Neck supple. No JVD present. No spinous process tenderness and no muscular tenderness present. Carotid bruit is not present.  Cardiovascular: Normal rate, regular rhythm and normal heart sounds.  Exam reveals no gallop and no friction rub.   No murmur heard. Pulses:      Radial pulses are 2+ on the right side, and 2+ on the left side.       Dorsalis pedis pulses are 2+ on the right side, and 2+ on the left side.  Pulmonary/Chest: Effort normal and breath sounds normal.  Abdominal: Soft. Bowel sounds are normal. He exhibits no distension.  Musculoskeletal: Normal range of motion.  Examination of the bilateral upper extremities revealed no deformities,  ecchymosis, full AROM, sensation intact, strength 5/5 including grip strength, TTP 2 posterior left shoulder.   Neurological: He is alert. Coordination normal.  Skin: Skin is warm and dry. He is not diaphoretic.  Psychiatric: He has a normal mood and affect. His behavior is normal.  Nursing note and vitals reviewed.   ED Course  Procedures (including critical care time) Labs Review Labs Reviewed  BASIC METABOLIC PANEL - Abnormal; Notable for the following:    Glucose, Bld 141 (*)    All other components within normal limits  CBC  I-STAT TROPOININ, ED  Rosezena Sensor, ED    Imaging Review Dg Chest 2 View  04/14/2016  CLINICAL DATA:  Chest pain, left arm pain starting this morning 6 a.m. EXAM: CHEST  2 VIEW COMPARISON:  01/17/2013 FINDINGS: Cardiomediastinal silhouette is stable. There is linear atelectasis or scarring left base. No acute infiltrate or pulmonary edema. Bony thorax is unremarkable. IMPRESSION: No active cardiopulmonary disease. Electronically Signed   By:  Natasha MeadLiviu  Pop M.D.   On: 04/14/2016 08:09   I have personally reviewed and evaluated these images and lab results as part of my medical decision-making.   EKG Interpretation   Date/Time:  Thursday April 14 2016 07:37:49 EDT Ventricular Rate:  86 PR Interval:  154 QRS Duration: 90 QT Interval:  358 QTC Calculation: 428 R Axis:   72 Text Interpretation:  Normal sinus rhythm Normal ECG Confirmed by ZAMMIT   MD, JOSEPH ([id number]) on 04/14/2016 9:31:21 AM      MDM   Final diagnoses:  Left shoulder pain  Numbness and tingling in left hand  Chest tightness or pressure   Patient is to be discharged with recommendation to follow up with PCP in regards to today's hospital visit. Shoulder pain is not likely of cardiac or pulmonary etiology d/t presentation, VSS, no tracheal deviation, no JVD or new murmur, RRR, breath sounds equal bilaterally, EKG without acute abnormalities, negative troponin, and negative CXR. But with  hx of these symptoms and the pts cardiac history I consulted Cardiology to see him.   Cardiology was consulted and recommended I discharge the pt if his delta troponin was neg and he will f/u with them for an outpt stress test. His second troponin was negative. Instructed patient to call cardiologist office to schedule an outpatient stress tests as soon as possible.  Pt has been advised to return to the ED if CP becomes exertional, associated with diaphoresis or nausea, radiates to left jaw/arm, worsens or becomes concerning in any way. Pt appears reliable for follow up and is agreeable to discharge.   Case has been discussed with and seen by Dr. Estell HarpinZammit who agrees with the above plan to discharge.     Jerre SimonJessica L Jahad Old, PA 04/14/16 [id number]  Bethann BerkshireJoseph Zammit, MD 04/16/16 1225

## 2016-04-14 NOTE — Consult Note (Signed)
Patient ID: Tim Heath MRN: [id number], DOB/AGE: 1961-09-26   Admit date: 04/14/2016   Primary Physician: Johny Blamer, MD Primary Cardiologist: Dr. [place] Reason for admission: chest pain  HPI:  Tim Heath is a 55 y.o. male with a history of CAD s/p DES to RCA (2003) and DES to RI (2004), HTN, HLD, morbid obesity and kidney stones who presented to [facility] ED today with chest pain.   He was seen in 12/2012 with left arm pain similar to his prior anginal pain.Cardiac catheterization at that time showed nonobstructive disease with patent stents.   He was recently seen by Dr. [place]and earlier this month for regular cardiology follow up. He was doing well from a cardiac standpoint.   He was in his usual state of health until this AM when he was sitting in his car before work. He had sudden onset of left arm tingling that was reminiscent on his previous anginal sx. He had some shoulder pain and left elbow pain but no chest pain. The pain and tingling was intermittent, mild, with associated nausea. No SOB or diaphoresis. He took 1 SL NTG that did not help. However, he did not get a HA like he usually does, so he thinks the Rx may be old.   He is not very active at baseline and does not formally exercise. 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 He has not noticed anymore DOE than usual and no exertional chest pain. No LE edema, orthopnea or PND. No dizziness or syncope. No palpitations.    Problem List  Past Medical History  Diagnosis Date  . Hypertension   . Hyperlipidemia   . History of Bell's palsy   . Coronary artery disease cardiologist-  dr [place]    DESx1 RCA 2003, DES x1  intermediate 2004/ normal LVF  . Left ureteral stone   . GERD (gastroesophageal reflux disease)   . History of hiatal hernia     Past Surgical History  Procedure Laterality Date  . Tonsillectomy    . Laparoscopic cholecystectomy  04-01-2003  . Coronary  angioplasty with stent placement  07-30-2002  dr [place]    abnormal cardiolite (inferior wall ischemia)  single-vessel CAD ,  balloon dilation and DES to RCA/  mild disease LAD 30-40%, non-obstructive/  perserved LV, ef 65%  . Coronary angioplasty with stent placement  05-16-2003  dr [place]    DES x1 to Intermediate branch (90%)/  pLAD 40-50%,  mD1  50%,  patent RCA stent/  normal LVF  . Cardiac catheterization  04-12-2004  &  01-18-2013  dr [place]    Non-obstructive CAD/  continued patency of stents fo RCA and Intermediate branch/  nomral LVF, ef 55-65%  . Cardiovascular stress test  last one 06-06-2011   dr [place]    normal perfusion study/  no ischemia or scar/  normal LV function and wall motion, ef 66%  . Cystoscopy/ureteroscopy/holmium laser/stent placement Left 04/28/2015    Procedure: CYSTOSCOPY/LEFT RETROGRADE LEFT URETEROSCOPY/STENT PLACEMENT/BLADDER BIOPSY WITH FULGERATION;  Surgeon: Jerilee Field, MD;  Location: [facility];  Service: Urology;  Laterality: Left;     Allergies  No Known Allergies   Home Medications  Prior to Admission medications   Medication Sig Start Date End Date Taking? Authorizing Provider  Acetaminophen (TYLENOL ARTHRITIS PAIN PO) Take 650 mg by mouth 2 (two) times daily as needed (pain).    Yes Historical Provider, MD  aspirin 81 MG tablet Take 81 mg by mouth daily.     Yes Historical Provider, MD  atorvastatin (LIPITOR) 80 MG tablet Take 1 tablet (80 mg total) by mouth daily. 11/16/15  Yes Neala Miggins M [place], MD  clopidogrel (PLAVIX) 75 MG tablet Take 1 tablet (75 mg total) by mouth daily. 11/16/15  Yes Jacon Whetzel M [place], MD  lisinopril (PRINIVIL,ZESTRIL) 20 MG tablet TAKE ONE TABLET BY MOUTH ONCE DAILY 12/14/15  Yes Makaia Rappa M [place], MD  metoprolol succinate (TOPROL-XL) 50 MG 24 hr tablet Take 1 tablet (50 mg total) by mouth daily. 11/16/15  Yes Catriona Dillenbeck M [place], MD  Multiple Vitamin (MULTIVITAMIN) tablet Take 1 tablet by mouth daily.     Yes  Historical Provider, MD  nitroGLYCERIN (NITROSTAT) 0.4 MG SL tablet Place 1 tablet (0.4 mg total) under the tongue every 5 (five) minutes as needed for chest pain. 01/17/13  Yes Nelson Julson M [place], MD  omeprazole (PRILOSEC) 20 MG capsule Take 1 capsule (20 mg total) by mouth daily. 03/27/14  Yes Thurston Brendlinger M [place], MD  tamsulosin (FLOMAX) 0.4 MG CAPS capsule Take 1 capsule (0.4 mg total) by mouth daily. Patient not taking: Reported on 04/14/2016 01/03/15   Francee PiccoloJennifer Piepenbrink, PA-C    Family History  Family History  Problem Relation Age of Onset  . Liver cancer Mother   . Emphysema Father   . Hypertension Brother   . Coronary artery disease Brother    Family Status  Relation Status Death Age  . Mother Deceased 1668  . Father Deceased 1570  . Sister Deceased   . Brother Alive   . Sister Deceased   . Brother Alive      Social History  Social History   Social History  . Marital Status: Married    Spouse Name: N/A  . Number of Children: 2  . Years of Education: N/A   Occupational History  . [employer]    Social History Main Topics  . Smoking status: Former Smoker    Quit date: 03/29/1986  . Smokeless tobacco: Not on file  . Alcohol Use: No  . Drug Use: No  . Sexual Activity: Not on file   Other Topics Concern  . Not on file   Social History Narrative      All other systems reviewed and are otherwise negative except as noted above.  Physical Exam  Blood pressure 122/81, pulse 65, temperature 98.8 F (37.1 C), temperature source Oral, resp. rate 16, height 5\' 8"  (1.727 m), weight 300 lb (136.079 kg), SpO2 98 %.  General: Pleasant, NAD Psych: Normal affect. Neuro: Alert and oriented X 3. Moves all extremities spontaneously. HEENT: Normal  Neck: Supple without bruits or JVD. Lungs:  Resp regular and unlabored, CTA. Heart: RRR no s3, s4, or murmurs. Abdomen: Soft, non-tender, non-distended, BS + x 4.  Extremities: No clubbing, cyanosis or edema. DP/PT/Radials 2+ and equal  bilaterally.  Labs  No results for input(s): CKTOTAL, CKMB, TROPONINI in the last 72 hours. Lab Results  Component Value Date   WBC 6.8 04/14/2016   HGB 15.9 04/14/2016   HCT 48.1 04/14/2016   MCV 93.9 04/14/2016   PLT 193 04/14/2016    Recent Labs Lab 04/14/16 0746  NA 140  K 4.0  CL 106  CO2 25  BUN 11  CREATININE 0.71  CALCIUM 9.4  GLUCOSE 141*   Lab Results  Component Value Date   CHOL 118* 11/23/2015   HDL 35* 11/23/2015   LDLCALC 74 11/23/2015  TRIG 47 11/23/2015      Radiology/Studies  Dg Chest 2 View  04/14/2016  CLINICAL DATA:  Chest pain, left arm pain starting this morning 6 a.m. EXAM: CHEST  2 VIEW COMPARISON:  01/17/2013 FINDINGS: Cardiomediastinal silhouette is stable. There is linear atelectasis or scarring left base. No acute infiltrate or pulmonary edema. Bony thorax is unremarkable. IMPRESSION: No active cardiopulmonary disease. Electronically Signed   By: Natasha MeadLiviu  Pop M.D.   On: 04/14/2016 08:09   12/2012 cath Final Conclusions:   1.  Nonobstructive coronary disease.  Continued patency of the stents in the ramus intermediate branch and RCA. 2.  Normal LV function.   ECG  NSR  ASSESSMENT AND PLAN  Tim Heath is a 55 y.o. male with a history of CAD s/p DES to RCA (2003) and DES to RI (2004), HTN, HLD, morbid obesity and kidney stones who [facility] with chest pain.   Left arm tingling: now resolved. Troponin x1 negative and ECG with no acute ST or TW changes. Will plan for Delta troponin and if negative with plan for outpatient nuclear stress test. Will give him an updated script for SL NTG. He needs a work note for today. He can go back to work tomorrow if delta trop negative.  -- Continue asa, plavix, BB and statin.   HTN: BP well controlled  HLD: continue statin  Morbid obesity: weight loss recommended    Signed, Cline CrockKathryn Thompson, PA-C 04/14/2016, 10:01 AM  Pager [phone] Patient seen and examined and history  reviewed. Agree with above findings and plan. 55 yo WM well known to me. Known history of CAD with remote stenting of the RCA and ramus intermediate branches in 2003 and 2004. Last myoview in 2012 was normal. Cardiac cath in 2014 showed nonobstructive CAD. Notes a couple of episodes of chest heaviness last week that resolved on its own. Today while at rest developed some discomfort in the left shoulder and elbow with numbness in his hand. Pain now resolved.  Exam is normal. VSS. Normal heart sounds. Lungs are clear. Ecg is normal. Troponin is normal.  Impression: atypical left arm pain. No objective evidence of ischemia. Will repeat troponin and if still normal plan to DC today with outpatient myoview study. Will update rx for Ntg. Continue other therapy.  Derek Huneycutt [place]Jordan, MDFACC 04/14/2016 10:36 AM

## 2016-04-14 NOTE — ED Notes (Signed)
Cp left shoulder pain  That radiates down left arm since 6 am states  2 nitro 30 mins apart and that did not help staTES HAS HEART HX 2 STENTS

## 2016-04-14 NOTE — ED Notes (Signed)
Pt. Ambulated to rest room 

## 2016-04-14 NOTE — ED Notes (Signed)
Cardiology at bedside.

## 2016-04-14 NOTE — ED Notes (Signed)
EDP at bedside explaining discharge instructions.  

## 2016-04-14 NOTE — Discharge Instructions (Signed)
Follow-up with your primary care physician and your cardiologist within 2 days to be reevaluated.  Return to emergency department if your chest tightness worsens, is exertional, associated sweating, dizziness, back pain, nausea, or vomiting.  Joint Pain Joint pain, which is also called arthralgia, can be caused by many things. Joint pain often goes away when you follow your health care provider's instructions for relieving pain at home. However, joint pain can also be caused by conditions that require further treatment. Common causes of joint pain include:  Bruising in the area of the joint.  Overuse of the joint.  Wear and tear on the joints that occur with aging (osteoarthritis).  Various other forms of arthritis.  A buildup of a crystal form of uric acid in the joint (gout).  Infections of the joint (septic arthritis) or of the bone (osteomyelitis). Your health care provider may recommend medicine to help with the pain. If your joint pain continues, additional tests may be needed to diagnose your condition. HOME CARE INSTRUCTIONS Watch your condition for any changes. Follow these instructions as directed to lessen the pain that you are feeling.  Take medicines only as directed by your health care provider.  Rest the affected area for as long as your health care provider says that you should. If directed to do so, raise the painful joint above the level of your heart while you are sitting or lying down.  Do not do things that cause or worsen pain.  If directed, apply ice to the painful area:  Put ice in a plastic bag.  Place a towel between your skin and the bag.  Leave the ice on for 20 minutes, 2-3 times per day.  Wear an elastic bandage, splint, or sling as directed by your health care provider. Loosen the elastic bandage or splint if your fingers or toes become numb and tingle, or if they turn cold and blue.  Begin exercising or stretching the affected area as directed by  your health care provider. Ask your health care provider what types of exercise are safe for you.  Keep all follow-up visits as directed by your health care provider. This is important. SEEK MEDICAL CARE IF:  Your pain increases, and medicine does not help.  Your joint pain does not improve within 3 days.  You have increased bruising or swelling.  You have a fever.  You lose 10 lb (4.5 kg) or more without trying. SEEK IMMEDIATE MEDICAL CARE IF:  You are not able to move the joint.  Your fingers or toes become numb or they turn cold and blue.   This information is not intended to replace advice given to you by your health care provider. Make sure you discuss any questions you have with your health care provider.   Document Released: 10/10/2005 Document Revised: 10/31/2014 Document Reviewed: 07/22/2014 Elsevier Interactive Patient Education 2016 Elsevier Inc.  Chest Pain Observation It is often hard to give a specific diagnosis for the cause of chest pain. Among other possibilities your symptoms might be caused by inadequate oxygen delivery to your heart (angina). Angina that is not treated or evaluated can lead to a heart attack (myocardial infarction) or death. Blood tests, electrocardiograms, and X-rays may have been done to help determine a possible cause of your chest pain. After evaluation and observation, your health care provider has determined that it is unlikely your pain was caused by an unstable condition that requires hospitalization. However, a full evaluation of your pain may need to  be completed, with additional diagnostic testing as directed. It is very important to keep your follow-up appointments. Not keeping your follow-up appointments could result in permanent heart damage, disability, or death. If there is any problem keeping your follow-up appointments, you must call your health care provider. HOME CARE INSTRUCTIONS  Due to the slight chance that your pain could be  angina, it is important to follow your health care provider's treatment plan and also maintain a healthy lifestyle:  Maintain or work toward achieving a healthy weight.  Stay physically active and exercise regularly.  Decrease your salt intake.  Eat a balanced, healthy diet. Talk to a dietitian to learn about heart-healthy foods.  Increase your fiber intake by including whole grains, vegetables, fruits, and nuts in your diet.  Avoid situations that cause stress, anger, or depression.  Take medicines as advised by your health care provider. Report any side effects to your health care provider. Do not stop medicines or adjust the dosages on your own.  Quit smoking. Do not use nicotine patches or gum until you check with your health care provider.  Keep your blood pressure, blood sugar, and cholesterol levels within normal limits.  Limit alcohol intake to no more than 1 drink per day for women who are not pregnant and 2 drinks per day for men.  Do not abuse drugs. SEEK IMMEDIATE MEDICAL CARE IF: You have severe chest pain or pressure which may include symptoms such as:  You feel pain or pressure in your arms, neck, jaw, or back.  You have severe back or abdominal pain, feel sick to your stomach (nauseous), or throw up (vomit).  You are sweating profusely.  You are having a fast or irregular heartbeat.  You feel short of breath while at rest.  You notice increasing shortness of breath during rest, sleep, or with activity.  You have chest pain that does not get better after rest or after taking your usual medicine.  You wake from sleep with chest pain.  You are unable to sleep because you cannot breathe.  You develop a frequent cough or you are coughing up blood.  You feel dizzy, faint, or experience extreme fatigue.  You develop severe weakness, dizziness, fainting, or chills. Any of these symptoms may represent a serious problem that is an emergency. Do not wait to see  if the symptoms will go away. Call your local emergency services (911 in the [place] Do not drive yourself to the hospital. MAKE SURE YOU:  Understand these instructions.  Will watch your condition.  Will get help right away if you are not doing well or get worse.   This information is not intended to replace advice given to you by your health care provider. Make sure you discuss any questions you have with your health care provider.   Document Released: 11/12/2010 Document Revised: 10/15/2013 Document Reviewed: 04/11/2013 Elsevier Interactive Patient Education [employer]2016 Elsevier Inc.

## 2016-04-15 ENCOUNTER — Telehealth: Payer: Self-pay | Admitting: Physician Assistant

## 2016-04-15 ENCOUNTER — Telehealth: Payer: Self-pay | Admitting: Cardiology

## 2016-04-15 NOTE — Telephone Encounter (Signed)
New message   Pt wife is calling because he is at work and she states he called her because he is having chest pain and tingling in his arm  Pt c/o of Chest Pain: STAT if CP now or developed within 24 hours  Please call pt or wife   Did not do smart phrase because pt is not with wife

## 2016-04-15 NOTE — Telephone Encounter (Signed)
Returned call to patient.Advised unable to schedule myoview sooner.Stated he feels ok at present no chest pain.Advised go to ER if needed.I will send message to Dr.Jordan.

## 2016-04-15 NOTE — Telephone Encounter (Signed)
Spoke to patient advised I will call Nuclear Medicine and let them know if they get a cancellation to call you.Advised to go to ER if has any more chest pain.

## 2016-04-15 NOTE — Telephone Encounter (Signed)
Returned call to wife she stated husband called her from work he had more chest pain this morning relieved by NTG x 1.Stated he went to ER yesterday and 2 day myoview was scheduled 7/5 and 7/6.Stated he wanted to have done sooner.Advised I will check on sooner appointment and call her back.

## 2016-04-15 NOTE — Telephone Encounter (Signed)
Nothing else to add at present  Peter SwazilandJordan MD, [facility]

## 2016-04-15 NOTE — Telephone Encounter (Signed)
Called the patient and gave him the new dates and times for his 2 day stress test.  ST

## 2016-04-19 ENCOUNTER — Ambulatory Visit (HOSPITAL_COMMUNITY): Payer: Managed Care, Other (non HMO) | Attending: Cardiology

## 2016-04-19 DIAGNOSIS — R079 Chest pain, unspecified: Secondary | ICD-10-CM

## 2016-04-19 DIAGNOSIS — I10 Essential (primary) hypertension: Secondary | ICD-10-CM | POA: Insufficient documentation

## 2016-04-19 DIAGNOSIS — R9439 Abnormal result of other cardiovascular function study: Secondary | ICD-10-CM | POA: Diagnosis not present

## 2016-04-19 DIAGNOSIS — R0609 Other forms of dyspnea: Secondary | ICD-10-CM | POA: Insufficient documentation

## 2016-04-19 MED ORDER — TECHNETIUM TC 99M TETROFOSMIN IV KIT
31.5000 | PACK | Freq: Once | INTRAVENOUS | Status: AC | PRN
  Administered 2016-04-19: 32 via INTRAVENOUS
  Filled 2016-04-19: qty 32

## 2016-04-19 MED ORDER — REGADENOSON 0.4 MG/5ML IV SOLN
0.4000 mg | Freq: Once | INTRAVENOUS | Status: AC
  Administered 2016-04-19: 0.4 mg via INTRAVENOUS

## 2016-04-21 ENCOUNTER — Ambulatory Visit (HOSPITAL_COMMUNITY): Payer: Managed Care, Other (non HMO) | Attending: Cardiology

## 2016-04-21 LAB — MYOCARDIAL PERFUSION IMAGING
CHL CUP NUCLEAR SSS: 5
CSEPPHR: 113 {beats}/min
LV dias vol: 110 mL (ref 62–150)
LVSYSVOL: 58 mL
NUC STRESS TID: 0.88
RATE: 0.31
Rest HR: 70 {beats}/min
SDS: 2
SRS: 3

## 2016-04-21 MED ORDER — TECHNETIUM TC 99M TETROFOSMIN IV KIT
33.0000 | PACK | Freq: Once | INTRAVENOUS | Status: AC | PRN
  Administered 2016-04-21: 33 via INTRAVENOUS
  Filled 2016-04-21: qty 33

## 2016-04-22 ENCOUNTER — Telehealth: Payer: Self-pay | Admitting: *Deleted

## 2016-04-22 NOTE — Telephone Encounter (Signed)
Received call that was directed to my voicemail. msg indicated someone call patient. Routed to AtalissaJennifer - context of call for recent stress test results.

## 2016-04-22 NOTE — Telephone Encounter (Signed)
Returned pts call.  We have went over his stress test results and scheduled pt to come in and see Carlean JewsKatie Thompson, PA-C 04/28/16. Pt agreeable with this plan.

## 2016-04-23 ENCOUNTER — Encounter (HOSPITAL_COMMUNITY): Payer: Self-pay

## 2016-04-23 ENCOUNTER — Other Ambulatory Visit: Payer: Self-pay

## 2016-04-23 ENCOUNTER — Observation Stay (HOSPITAL_COMMUNITY)
Admission: EM | Admit: 2016-04-23 | Discharge: 2016-04-25 | DRG: 287 | Disposition: A | Discharge status: Home / Self Care | Payer: Managed Care, Other (non HMO) | Attending: Cardiology | Admitting: Cardiology

## 2016-04-23 DIAGNOSIS — K219 Gastro-esophageal reflux disease without esophagitis: Secondary | ICD-10-CM | POA: Diagnosis not present

## 2016-04-23 DIAGNOSIS — R079 Chest pain, unspecified: Secondary | ICD-10-CM

## 2016-04-23 DIAGNOSIS — Z6841 Body Mass Index (BMI) 40.0 and over, adult: Secondary | ICD-10-CM

## 2016-04-23 DIAGNOSIS — Z7902 Long term (current) use of antithrombotics/antiplatelets: Secondary | ICD-10-CM | POA: Diagnosis not present

## 2016-04-23 DIAGNOSIS — I2089 Other forms of angina pectoris: Secondary | ICD-10-CM | POA: Diagnosis present

## 2016-04-23 DIAGNOSIS — I208 Other forms of angina pectoris: Secondary | ICD-10-CM | POA: Diagnosis present

## 2016-04-23 DIAGNOSIS — I10 Essential (primary) hypertension: Secondary | ICD-10-CM | POA: Diagnosis not present

## 2016-04-23 DIAGNOSIS — Z7982 Long term (current) use of aspirin: Secondary | ICD-10-CM | POA: Diagnosis not present

## 2016-04-23 DIAGNOSIS — I200 Unstable angina: Secondary | ICD-10-CM | POA: Diagnosis not present

## 2016-04-23 DIAGNOSIS — Z87891 Personal history of nicotine dependence: Secondary | ICD-10-CM

## 2016-04-23 DIAGNOSIS — Z79899 Other long term (current) drug therapy: Secondary | ICD-10-CM | POA: Diagnosis not present

## 2016-04-23 DIAGNOSIS — E785 Hyperlipidemia, unspecified: Secondary | ICD-10-CM | POA: Diagnosis present

## 2016-04-23 DIAGNOSIS — I25110 Atherosclerotic heart disease of native coronary artery with unstable angina pectoris: Principal | ICD-10-CM | POA: Diagnosis present

## 2016-04-23 DIAGNOSIS — Z955 Presence of coronary angioplasty implant and graft: Secondary | ICD-10-CM | POA: Diagnosis not present

## 2016-04-23 DIAGNOSIS — I2510 Atherosclerotic heart disease of native coronary artery without angina pectoris: Secondary | ICD-10-CM | POA: Diagnosis present

## 2016-04-23 DIAGNOSIS — Z8249 Family history of ischemic heart disease and other diseases of the circulatory system: Secondary | ICD-10-CM | POA: Diagnosis not present

## 2016-04-23 DIAGNOSIS — R931 Abnormal findings on diagnostic imaging of heart and coronary circulation: Secondary | ICD-10-CM | POA: Diagnosis present

## 2016-04-23 LAB — CBC
HEMATOCRIT: 49.2 % (ref 39.0–52.0)
Hemoglobin: 16.4 g/dL (ref 13.0–17.0)
MCH: 31.5 pg (ref 26.0–34.0)
MCHC: 33.3 g/dL (ref 30.0–36.0)
MCV: 94.4 fL (ref 78.0–100.0)
PLATELETS: 218 10*3/uL (ref 150–400)
RBC: 5.21 MIL/uL (ref 4.22–5.81)
RDW: 12.7 % (ref 11.5–15.5)
WBC: 7.3 10*3/uL (ref 4.0–10.5)

## 2016-04-23 LAB — MAGNESIUM: Magnesium: 1.8 mg/dL (ref 1.7–2.4)

## 2016-04-23 LAB — I-STAT TROPONIN, ED
Troponin i, poc: 0 ng/mL (ref 0.00–0.08)
Troponin i, poc: 0 ng/mL (ref 0.00–0.08)

## 2016-04-23 LAB — PROTIME-INR
INR: 0.95 (ref 0.00–1.49)
Prothrombin Time: 12.9 seconds (ref 11.6–15.2)

## 2016-04-23 LAB — T4, FREE: FREE T4: 0.98 ng/dL (ref 0.61–1.12)

## 2016-04-23 LAB — I-STAT CHEM 8, ED
BUN: 15 mg/dL (ref 6–20)
CALCIUM ION: 1.11 mmol/L — AB (ref 1.13–1.30)
CHLORIDE: 106 mmol/L (ref 101–111)
CREATININE: 0.6 mg/dL — AB (ref 0.61–1.24)
Glucose, Bld: 124 mg/dL — ABNORMAL HIGH (ref 65–99)
HCT: 49 % (ref 39.0–52.0)
Hemoglobin: 16.7 g/dL (ref 13.0–17.0)
Potassium: 4.1 mmol/L (ref 3.5–5.1)
Sodium: 141 mmol/L (ref 135–145)
TCO2: 23 mmol/L (ref 0–100)

## 2016-04-23 LAB — HEPARIN LEVEL (UNFRACTIONATED): HEPARIN UNFRACTIONATED: 0.93 [IU]/mL — AB (ref 0.30–0.70)

## 2016-04-23 LAB — TROPONIN I: Troponin I: <0.03 ng/mL (ref <0.03)

## 2016-04-23 LAB — TSH: TSH: 1.066 u[IU]/mL (ref 0.350–4.500)

## 2016-04-23 MED ORDER — ZOLPIDEM TARTRATE 5 MG PO TABS: 5.0000 mg | ORAL_TABLET | Freq: Every evening | ORAL | Status: DC | PRN

## 2016-04-23 MED ORDER — LISINOPRIL 20 MG PO TABS
20.0000 mg | ORAL_TABLET | Freq: Every day | ORAL | Status: DC
  Administered 2016-04-24 – 2016-04-25 (×2): 20 mg via ORAL
  Filled 2016-04-23 (×2): qty 1

## 2016-04-23 MED ORDER — HEPARIN BOLUS VIA INFUSION
4000.0000 [IU] | Freq: Once | INTRAVENOUS | Status: AC
  Administered 2016-04-23: 4000 [IU] via INTRAVENOUS
  Filled 2016-04-23: qty 4000

## 2016-04-23 MED ORDER — ASPIRIN EC 81 MG PO TBEC
81.0000 mg | DELAYED_RELEASE_TABLET | Freq: Every day | ORAL | Status: DC
  Administered 2016-04-24: 81 mg via ORAL
  Filled 2016-04-23: qty 1

## 2016-04-23 MED ORDER — ASPIRIN 300 MG RE SUPP: 300.0000 mg | RECTAL | Status: AC

## 2016-04-23 MED ORDER — NITROGLYCERIN 2 % TD OINT
0.5000 [in_us] | TOPICAL_OINTMENT | Freq: Four times a day (QID) | TRANSDERMAL | Status: DC
Start: 2016-04-23 — End: 2016-04-25
  Administered 2016-04-23 – 2016-04-25 (×9): 0.5 [in_us] via TOPICAL
  Filled 2016-04-23: qty 1
  Filled 2016-04-23: qty 30

## 2016-04-23 MED ORDER — METOPROLOL SUCCINATE ER 50 MG PO TB24
50.0000 mg | ORAL_TABLET | Freq: Every day | ORAL | Status: DC
  Administered 2016-04-24 – 2016-04-25 (×2): 50 mg via ORAL
  Filled 2016-04-23 (×2): qty 1

## 2016-04-23 MED ORDER — ADULT MULTIVITAMIN W/MINERALS CH
1.0000 | ORAL_TABLET | Freq: Every day | ORAL | Status: DC
  Administered 2016-04-24 – 2016-04-25 (×2): 1 via ORAL
  Filled 2016-04-23 (×2): qty 1

## 2016-04-23 MED ORDER — CLOPIDOGREL BISULFATE 75 MG PO TABS
75.0000 mg | ORAL_TABLET | Freq: Every day | ORAL | Status: DC
  Administered 2016-04-24 – 2016-04-25 (×2): 75 mg via ORAL
  Filled 2016-04-23 (×2): qty 1

## 2016-04-23 MED ORDER — ASPIRIN 81 MG PO CHEW: 324.0000 mg | CHEWABLE_TABLET | ORAL | Status: AC

## 2016-04-23 MED ORDER — ATORVASTATIN CALCIUM 80 MG PO TABS
80.0000 mg | ORAL_TABLET | Freq: Every day | ORAL | Status: DC
  Administered 2016-04-23 – 2016-04-24 (×2): 80 mg via ORAL
  Filled 2016-04-23 (×2): qty 1

## 2016-04-23 MED ORDER — ALPRAZOLAM 0.25 MG PO TABS: 0.2500 mg | ORAL_TABLET | Freq: Two times a day (BID) | ORAL | Status: DC | PRN

## 2016-04-23 MED ORDER — HEPARIN (PORCINE) IN NACL 100-0.45 UNIT/ML-% IJ SOLN
1150.0000 [IU]/h | INTRAMUSCULAR | Status: DC
  Administered 2016-04-23: 1250 [IU]/h via INTRAVENOUS
  Administered 2016-04-23: 1400 [IU]/h via INTRAVENOUS
  Filled 2016-04-23 (×2): qty 250

## 2016-04-23 MED ORDER — NITROGLYCERIN 0.4 MG SL SUBL: 0.4000 mg | SUBLINGUAL_TABLET | SUBLINGUAL | Status: DC | PRN

## 2016-04-23 MED ORDER — ACETAMINOPHEN 325 MG PO TABS: 650.0000 mg | ORAL_TABLET | ORAL | Status: DC | PRN

## 2016-04-23 MED ORDER — PANTOPRAZOLE SODIUM 40 MG PO TBEC
40.0000 mg | DELAYED_RELEASE_TABLET | Freq: Every day | ORAL | Status: DC
  Administered 2016-04-24 – 2016-04-25 (×2): 40 mg via ORAL
  Filled 2016-04-23 (×2): qty 1

## 2016-04-23 MED ORDER — ONDANSETRON HCL 4 MG/2ML IJ SOLN: 4.0000 mg | Freq: Four times a day (QID) | INTRAMUSCULAR | Status: DC | PRN

## 2016-04-23 MED ORDER — SODIUM CHLORIDE 0.9 % IV SOLN
INTRAVENOUS | Status: DC
  Administered 2016-04-23: 13:00:00 via INTRAVENOUS

## 2016-04-23 MED ORDER — ASPIRIN 81 MG PO CHEW
243.0000 mg | CHEWABLE_TABLET | Freq: Once | ORAL | Status: AC
  Administered 2016-04-23: 243 mg via ORAL
  Filled 2016-04-23: qty 3

## 2016-04-23 MED ORDER — ASPIRIN 81 MG PO TABS: 81.0000 mg | ORAL_TABLET | Freq: Every day | ORAL | Status: DC

## 2016-04-23 NOTE — ED Notes (Signed)
MD at bedside. 

## 2016-04-23 NOTE — H&P (Signed)
Tim Heath is an 55 y.o. male.    Primary Cardiologist:Dr. Swaziland PCP: Johny Blamer, MD  Chief Complaint: chest pain  HPI:  43 yom with history of CAD s/p DES to RCA (2003) and DES to RI (2004), HTN, HLD, morbid obesity and kidney stones was seen in ER 04/14/16 with Lt arm pain which had been similar to prior angina though on last cath 01/18/13 which revealed patent stent at ramus intermediate and RCA.   His troponin was normal and EKG so pt was discharged and had  ETT myoview was + for moderate size mild severity partially reversible inferoseptal and apical perfusion defects with EF 48%.  Dr. Swaziland has reviewed and pt was to be seen in office and plan for cardiac cath.  Today he went to work but was having L hand tingling on arrival and then it progressed to Lt neck and Lt shoulder and into chest.  He did take NTG  With some relief but it was still present.  He was a little lightheaded and nauseated as well.  He decided to come to ER with the knowledge his stress test was abnormal.    Troponin is neg and no acute changes in EKG. He is fairly comfortable now.    Past Medical History  Diagnosis Date  . Hypertension   . Hyperlipidemia   . History of Bell's palsy   . Coronary artery disease cardiologist-  dr Swaziland    DESx1 RCA 2003, DES x1  intermediate 2004/ normal LVF  . Left ureteral stone   . GERD (gastroesophageal reflux disease)   . History of hiatal hernia     Past Surgical History  Procedure Laterality Date  . Tonsillectomy    . Laparoscopic cholecystectomy  04-01-2003  . Coronary angioplasty with stent placement  07-30-2002  dr Swaziland    abnormal cardiolite (inferior wall ischemia)  single-vessel CAD ,  balloon dilation and DES to RCA/  mild disease LAD 30-40%, non-obstructive/  perserved LV, ef 65%  . Coronary angioplasty with stent placement  05-16-2003  dr Swaziland    DES x1 to Intermediate branch (90%)/  pLAD 40-50%,  mD1  50%,  patent RCA stent/   normal LVF  . Cardiac catheterization  04-12-2004  &  01-18-2013  dr Swaziland    Non-obstructive CAD/  continued patency of stents fo RCA and Intermediate branch/  nomral LVF, ef 55-65%  . Cardiovascular stress test  last one 06-06-2011   dr Swaziland    normal perfusion study/  no ischemia or scar/  normal LV function and wall motion, ef 66%  . Cystoscopy/ureteroscopy/holmium laser/stent placement Left 04/28/2015    Procedure: CYSTOSCOPY/LEFT RETROGRADE LEFT URETEROSCOPY/STENT PLACEMENT/BLADDER BIOPSY WITH FULGERATION;  Surgeon: Jerilee Field, MD;  Location: [facility];  Service: Urology;  Laterality: Left;    Family History  Problem Relation Age of Onset  . Liver cancer Mother   . Emphysema Father   . Hypertension Brother   . Coronary artery disease Brother    Social History:  reports that he quit smoking about 30 years ago. He does not have any smokeless tobacco history on file. He reports that he does not drink alcohol or use illicit drugs.  Allergies: No Known Allergies  OUTPATIENT MEDICATIONS: No current facility-administered medications on file prior to encounter.   Current Outpatient Prescriptions on File Prior to Encounter  Medication Sig Dispense Refill  . aspirin 81 MG tablet Take 81 mg by  mouth daily.      Marland Kitchen. atorvastatin (LIPITOR) 80 MG tablet Take 1 tablet (80 mg total) by mouth daily. 90 tablet 2  . clopidogrel (PLAVIX) 75 MG tablet Take 1 tablet (75 mg total) by mouth daily. 90 tablet 2  . lisinopril (PRINIVIL,ZESTRIL) 20 MG tablet TAKE ONE TABLET BY MOUTH ONCE DAILY 30 tablet 4  . metoprolol succinate (TOPROL-XL) 50 MG 24 hr tablet Take 1 tablet (50 mg total) by mouth daily. 90 tablet 2  . Multiple Vitamin (MULTIVITAMIN) tablet Take 1 tablet by mouth daily.      . nitroGLYCERIN (NITROSTAT) 0.4 MG SL tablet Place 1 tablet (0.4 mg total) under the tongue every 5 (five) minutes as needed for chest pain. 100 tablet 0  . omeprazole (PRILOSEC) 20 MG capsule Take  1 capsule (20 mg total) by mouth daily. 90 capsule 3  . tamsulosin (FLOMAX) 0.4 MG CAPS capsule Take 1 capsule (0.4 mg total) by mouth daily. (Patient not taking: Reported on 04/14/2016) 10 capsule 0   IS NO LONGER ON FLOMAX  Results for orders placed or performed during the hospital encounter of 04/23/16 (from the past 48 hour(s))  CBC     Status: None   Collection Time: 04/23/16  8:26 AM  Result Value Ref Range   WBC 7.3 4.0 - 10.5 K/uL   RBC 5.21 4.22 - 5.81 MIL/uL   Hemoglobin 16.4 13.0 - 17.0 g/dL   HCT 40.949.2 81.139.0 - 91.452.0 %   MCV 94.4 78.0 - 100.0 fL   MCH 31.5 26.0 - 34.0 pg   MCHC 33.3 30.0 - 36.0 g/dL   RDW 78.212.7 95.611.5 - 21.315.5 %   Platelets 218 150 - 400 K/uL  I-stat troponin, ED     Status: None   Collection Time: 04/23/16  8:33 AM  Result Value Ref Range   Troponin i, poc 0.00 0.00 - 0.08 ng/mL   Comment 3            Comment: Due to the release kinetics of cTnI, a negative result within the first hours of the onset of symptoms does not rule out myocardial infarction with certainty. If myocardial infarction is still suspected, repeat the test at appropriate intervals.   I-stat chem 8, ed     Status: Abnormal   Collection Time: 04/23/16  8:35 AM  Result Value Ref Range   Sodium 141 135 - 145 mmol/L   Potassium 4.1 3.5 - 5.1 mmol/L   Chloride 106 101 - 111 mmol/L   BUN 15 6 - 20 mg/dL   Creatinine, Ser 0.860.60 (L) 0.61 - 1.24 mg/dL   Glucose, Bld 578124 (H) 65 - 99 mg/dL   Calcium, Ion 4.691.11 (L) 1.13 - 1.30 mmol/L   TCO2 23 0 - 100 mmol/L   Hemoglobin 16.7 13.0 - 17.0 g/dL   HCT 62.949.0 52.839.0 - 41.352.0 %   No results found.  ROS: General:no colds or fevers, no weight changes Skin:no rashes or ulcers HEENT:no blurred vision, no congestion CV:see HPI PUL:see HPI GI:no diarrhea constipation or melena, no indigestion GU:no hematuria, no dysuria- he is no longer taking the flomax and has not had problems MS:no joint pain, no claudication Neuro:no syncope, + lightheadedness  today Endo:no diabetes, no thyroid disease   Blood pressure 143/89, pulse 84, temperature 97.9 F (36.6 C), temperature source Oral, resp. rate 17, height 5\' 8"  (1.727 m), weight 293 lb (132.904 kg), SpO2 98 %. PE: General:Pleasant affect, NAD Skin:Warm and dry, brisk capillary refill HEENT:normocephalic, sclera  clear, mucus membranes moist Neck:supple, no JVD, no bruits  Heart:S1S2 RRR without murmur, gallup, rub or click Lungs:without rales, rhonchi, does have occ wheezes ZOX:WRUEAAbd:obese, soft, non tender, + BS, do not palpate liver spleen or masses Ext:no lower ext edema, 2+ pedal pulses, 2+ radial pulses Neuro:alert and oriented X 3, MAE, follows commands, + facial symmetry    Assessment/Plan Principal Problem:   Angina pectoris, crescendo (HCC)- recent + nuc.  Troponin is neg. Plan to admit and keep until cath on Monday.  Serial troponins, add IV heparin, con't ASA, Plavix, BB. Also NTG paste.  MD to see.   Active Problems:   Abnormal nuclear cardiac imaging test see above.    Hypertension controlled    Coronary artery disease with previous stents DES to RCA and RI.    Hyperlipidemia LDL goal <70 on statin    Nada BoozerLaura Ingold Nurse Practitioner Certified [facility] Pager [phone] or after 5pm or weekends call [phone] 04/23/2016, 9:01 AM  As above, patient seen and examined. Briefly he is a 55 year old male with past medical history of coronary artery disease with prior PCI of his right coronary and ramus intermedius, hypertension, hyperlipidemia with chest pain. Patient recently seen in the emergency room with chest and left upper extremity pain. He was discharged and had an outpatient nuclear study. There is a partially reversible anteroseptal and apical defect and ejection fraction 48%. He had recurrent chest pain, left upper extremity pain and tingling today. Symptoms possibly similar to previous cardiac pain. Duration approximately one hour. Some improvement  with nitroglycerin. Presently pain free. Associated nausea. Electrocardiogram shows sinus rhythm with no ST changes. Initial troponin normal. 1 unstable angina-patient presents with recurrent chest pain. Recent nuclear study abnormal. Feel definitive evaluation is warranted. Plan cardiac catheterization on Monday. The risks and benefits were discussed including myocardial infarction CVA and death. He agrees to proceed. Treatment with aspirin, heparin and nitrates. 2 hypertension--continue preadmission medications. 3 hyperlipidemia-Continue statin. Olga MillersBrian Magda Muise, MD

## 2016-04-23 NOTE — Progress Notes (Signed)
ANTICOAGULATION CONSULT NOTE   Pharmacy Consult for Heparin Indication: chest pain/ACS  No Known Allergies  Patient Measurements: Height: 5\' 8"  (172.7 cm) Weight: 293 lb (132.904 kg) IBW/kg (Calculated) : 68.4 Heparin Dosing Weight: 100 kg  Vital Signs: Temp: 98.2 F (36.8 C) (07/01 1250) Temp Source: Oral (07/01 1250) BP: 127/85 mmHg (07/01 1250) Pulse Rate: 63 (07/01 1250)  Labs:  Recent Labs  04/23/16 0826 04/23/16 0835 04/23/16 1318 04/23/16 1810  HGB 16.4 16.7  --   --   HCT 49.2 49.0  --   --   PLT 218  --   --   --   LABPROT  --   --  12.9  --   INR  --   --  0.95  --   HEPARINUNFRC  --   --   --  0.93*  CREATININE  --  0.60*  --   --   TROPONINI  --   --  <0.03  --     Estimated Creatinine Clearance: 137.4 mL/min (by C-G formula based on Cr of 0.6).  Assessment: [age] Y[facility] with hx CAD [facility] CP that radiated to the L-arm. Pt with history of abnormal stress test - pharmacy has not been consulted to start heparin for ACS/CP.  CBC wnl.  Initial heparin elevated at 0.93 units/mL. Confirmed with RN Shanda BumpsJessica infusion is running at correct rate, no bleeding or oozing.  Goal of Therapy:  Heparin level 0.3-0.7 units/ml Monitor platelets by anticoagulation protocol: Yes   Plan:  -reduce heparin infusion to 1250 units/hr -next HL with AM labs -daily HL and CBC -plans for cath Monday  Adric Wrede D. Sherman Lipuma, PharmD, BCPS Clinical Pharmacist Pager: [phone] 04/23/2016 6:59 PM

## 2016-04-23 NOTE — ED Provider Notes (Signed)
CSN: [id number]     Arrival date & time 04/23/16  0801 History   First MD Initiated Contact with Patient 04/23/16 0809     Chief Complaint  Patient presents with  . Chest Pain   HPI  Tim Heath is a 55 y.o. male PMH significant for HTN, HLD, CAD (stent x2), hiatal hernia presenting with chest pain since 5:30 this morning. He describes his chest pain as left-sided, radiating down his left arm, constant, alleviated by nitroglycerin, nonexertional, pressure, 7/10 pain scale. He was seen last week for the same complaint and was discharged. He followed up with his cardiologist who had ordered a stress test on him which had abnormal results. He endorses associated shortness of breath (nonexertional), nausea. He is currently asymptomatic. He denies fevers, cough, abdominal pain, emesis.  He sees Dr. SwazilandJordan with cardiology.   Past Medical History  Diagnosis Date  . Hypertension   . Hyperlipidemia   . History of Bell's palsy   . Coronary artery disease cardiologist-  dr Swazilandjordan    DESx1 RCA 2003, DES x1  intermediate 2004/ normal LVF  . Left ureteral stone   . GERD (gastroesophageal reflux disease)   . History of hiatal hernia    Past Surgical History  Procedure Laterality Date  . Tonsillectomy    . Laparoscopic cholecystectomy  04-01-2003  . Coronary angioplasty with stent placement  07-30-2002  dr Swazilandjordan    abnormal cardiolite (inferior wall ischemia)  single-vessel CAD ,  balloon dilation and DES to RCA/  mild disease LAD 30-40%, non-obstructive/  perserved LV, ef 65%  . Coronary angioplasty with stent placement  05-16-2003  dr Swazilandjordan    DES x1 to Intermediate branch (90%)/  pLAD 40-50%,  mD1  50%,  patent RCA stent/  normal LVF  . Cardiac catheterization  04-12-2004  &  01-18-2013  dr Swazilandjordan    Non-obstructive CAD/  continued patency of stents fo RCA and Intermediate branch/  nomral LVF, ef 55-65%  . Cardiovascular stress test  last one 06-06-2011   dr Swazilandjordan    normal perfusion  study/  no ischemia or scar/  normal LV function and wall motion, ef 66%  . Cystoscopy/ureteroscopy/holmium laser/stent placement Left 04/28/2015    Procedure: CYSTOSCOPY/LEFT RETROGRADE LEFT URETEROSCOPY/STENT PLACEMENT/BLADDER BIOPSY WITH FULGERATION;  Surgeon: Jerilee FieldMatthew Eskridge, MD;  [facility];  Service: Urology;  Laterality: Left;   Family History  Problem Relation Age of Onset  . Liver cancer Mother   . Emphysema Father   . Hypertension Brother   . Coronary artery disease Brother    Social History  Substance Use Topics  . Smoking status: Former Smoker    Quit date: 03/29/1986  . Smokeless tobacco: None  . Alcohol Use: No    Review of Systems  Ten systems are reviewed and are negative for acute change except as noted in the HPI  Allergies  Review of patient's allergies indicates no known allergies.  Home Medications   Prior to Admission medications   Medication Sig Start Date End Date Taking? Authorizing Provider  Acetaminophen (TYLENOL ARTHRITIS PAIN PO) Take 650 mg by mouth 2 (two) times daily as needed (pain).     Historical Provider, MD  aspirin 81 MG tablet Take 81 mg by mouth daily.      Historical Provider, MD  atorvastatin (LIPITOR) 80 MG tablet Take 1 tablet (80 mg total) by mouth daily. 11/16/15   Peter M SwazilandJordan, MD  clopidogrel (PLAVIX) 75 MG tablet Take  1 tablet (75 mg total) by mouth daily. 11/16/15   Peter M Swaziland, MD  lisinopril (PRINIVIL,ZESTRIL) 20 MG tablet TAKE ONE TABLET BY MOUTH ONCE DAILY 12/14/15   Peter M Swaziland, MD  metoprolol succinate (TOPROL-XL) 50 MG 24 hr tablet Take 1 tablet (50 mg total) by mouth daily. 11/16/15   Peter M Swaziland, MD  Multiple Vitamin (MULTIVITAMIN) tablet Take 1 tablet by mouth daily.      Historical Provider, MD  nitroGLYCERIN (NITROSTAT) 0.4 MG SL tablet Place 1 tablet (0.4 mg total) under the tongue every 5 (five) minutes as needed for chest pain. 04/14/16   Jerre Simon, PA  omeprazole (PRILOSEC) 20  MG capsule Take 1 capsule (20 mg total) by mouth daily. 03/27/14   Peter M Swaziland, MD  tamsulosin (FLOMAX) 0.4 MG CAPS capsule Take 1 capsule (0.4 mg total) by mouth daily. Patient not taking: Reported on 04/14/2016 01/03/15   Victorino Dike Piepenbrink, PA-C   BP 143/89 mmHg  Pulse 84  Temp(Src) 97.9 F (36.6 C) (Oral)  Resp 17  Ht  (1.727 m)  Wt 132.904 kg  BMI 44.56 kg/m2  SpO2 98% Physical Exam  Constitutional: He is oriented to person, place, and time. He appears well-developed and well-nourished. No distress.  Obese  HENT:  Head: Normocephalic and atraumatic.  Mouth/Throat: Oropharynx is clear and moist. No oropharyngeal exudate.  Eyes: Conjunctivae are normal. Pupils are equal, round, and reactive to light. Right eye exhibits no discharge. Left eye exhibits no discharge. No scleral icterus.  Neck: Normal range of motion. No tracheal deviation present.  Cardiovascular: Normal rate, regular rhythm, normal heart sounds and intact distal pulses.  Exam reveals no gallop and no friction rub.   No murmur heard. Pulmonary/Chest: Effort normal and breath sounds normal. No respiratory distress. He has no wheezes. He has no rales. He exhibits no tenderness.  Abdominal: Soft. Bowel sounds are normal. He exhibits no distension and no mass. There is no tenderness. There is no rebound and no guarding.  Musculoskeletal: Normal range of motion. He exhibits no edema or tenderness.  Lymphadenopathy:    He has no cervical adenopathy.  Neurological: He is alert and oriented to person, place, and time. Coordination normal.  Skin: Skin is warm and dry. No rash noted. He is not diaphoretic. No erythema.  Psychiatric: He has a normal mood and affect. His behavior is normal.  Nursing note and vitals reviewed.   ED Course  Procedures  Labs Review Labs Reviewed  I-STAT CHEM 8, ED - Abnormal; Notable for the following:    Creatinine, Ser 0.60 (*)    Glucose, Bld 124 (*)    Calcium, Ion 1.11 (*)    All  other components within normal limits  CBC  I-STAT TROPOININ, ED    Imaging Review No results found. I have personally reviewed and evaluated these images and lab results as part of my medical decision-making.   EKG Interpretation   Date/Time:  Saturday April 23 2016 08:05:34 EDT Ventricular Rate:  84 PR Interval:  152 QRS Duration: 90 QT Interval:  360 QTC Calculation: 425 R Axis:   59 Text Interpretation:  Normal sinus rhythm Normal ECG No significant change  since last tracing Confirmed by JACUBOWITZ  MD, SAM ([id number]) on 04/23/2016  8:13:32 AM      MDM   Final diagnoses:  Chest pain, unspecified chest pain type   Patient nontoxic appearing, VSS. Based on patient history and physical exam, most likely etiologies include ACS vs  anxiety. Less likely etiologies include ACS, Prinzmetal's/cocaine-induced angina, pericarditis/pericardial effusion, cardiac tamponade, constrictive pericarditis, myocarditis, aortic dissection, thoracic aortic aneurysm, CHF/acute pulmonary edema, pneumonia, pleuritis, pneumothorax, tension pneumothorax, pulmonary embolism, pulmonary HTN, GERD, esophageal spasm, Mallory-Weiss tear, Boerhaave syndrome, peptic ulcer diease, biliary disease, pancreatitis, chest wall pain/costochondritis, osteoarthritis/radiculopathy, herpes zoster.  Given recent abnormal stress test and scheduled cath, will most likely admit for chest pain rule out. Gave patient additional aspirin.  8:52 AM Appreciate cardiology consult. Dr. Jens Somrenshaw will evaluate patient.  Patient up for admission.   Melton KrebsSamantha Nicole Samuel Rittenhouse, PA-C 04/23/16 1431  Doug SouSam Jacubowitz, MD 04/23/16 [id number]

## 2016-04-23 NOTE — ED Provider Notes (Signed)
Patient had episode of anterior chest discomfort described as pressure onset 5:30 AM today nonradiating. Associated symptoms include tingling in left arm. He treated himself with that one sublingual nitroglycerin with relief. He is presently asymptomatic. Pain feels similar to pains had in the past with "heart pain. Presently asymptomatic. On exam no distress lungs clear auscultation heart regular rate and rhythm abdomen obese nontender extremities without edema  Doug SouSam Jamell Laymon, MD 04/23/16 1732

## 2016-04-23 NOTE — Progress Notes (Signed)
ANTICOAGULATION CONSULT NOTE - Initial Consult  Pharmacy Consult for Heparin Indication: chest pain/ACS  No Known Allergies  Patient Measurements: Height: 5\' 8"  (172.7 cm) Weight: 293 lb (132.904 kg) IBW/kg (Calculated) : 68.4 Heparin Dosing Weight: 100 kg  Vital Signs: Temp: 97.9 F (36.6 C) (07/01 0807) Temp Source: Oral (07/01 0807) BP: 132/81 mmHg (07/01 1030) Pulse Rate: 65 (07/01 1030)  Labs:  Recent Labs  04/23/16 0826 04/23/16 0835  HGB 16.4 16.7  HCT 49.2 49.0  PLT 218  --   CREATININE  --  0.60*    Estimated Creatinine Clearance: 137.4 mL/min (by C-G formula based on Cr of 0.6).   Medical History: Past Medical History  Diagnosis Date  . Hypertension   . Hyperlipidemia   . History of Bell's palsy   . Coronary artery disease cardiologist-  dr Swazilandjordan    DESx1 RCA 2003, DES x1  intermediate 2004/ normal LVF  . Left ureteral stone   . GERD (gastroesophageal reflux disease)   . History of hiatal hernia     Medications:   Assessment: [age] YOM with hx CAD who [facility] with CP that radiated to the L-arm. Pt with history of abnormal stress test - pharmacy has not been consulted to start heparin for ACS/CP.  CBC wnl. On plavix/ASA PTA but no other blood thinners. No recent surgeries or hx CVA noted. Hep Wt: 100 kg  Goal of Therapy:  Heparin level 0.3-0.7 units/ml Monitor platelets by anticoagulation protocol: Yes   Plan:  1. Heparin bolus of 4000 units x 1 2. Start heparin at 1400 units/hr (14 ml/hr) 3. Daily heparin levels, CBC 4. Will continue to monitor for any signs/symptoms of bleeding and will follow up with heparin level in 6 hours   Georgina PillionElizabeth Janica Eldred, PharmD, BCPS Clinical Pharmacist Pager: [phone] 04/23/2016 11:50 AM

## 2016-04-23 NOTE — ED Notes (Signed)
Patient was at work this am and developed chest tightness 0530. Took 1 sl ntg with minimal relief. Nausea with same. Had stress test this past week and was told abnormal and cath is being scheduled. Has 2 stents but never MI

## 2016-04-24 ENCOUNTER — Other Ambulatory Visit: Payer: Self-pay

## 2016-04-24 DIAGNOSIS — I200 Unstable angina: Secondary | ICD-10-CM | POA: Diagnosis not present

## 2016-04-24 LAB — HEPARIN LEVEL (UNFRACTIONATED)
HEPARIN UNFRACTIONATED: 0.74 [IU]/mL — AB (ref 0.30–0.70)
HEPARIN UNFRACTIONATED: 0.52 [IU]/mL (ref 0.30–0.70)
HEPARIN UNFRACTIONATED: 0.72 [IU]/mL — AB (ref 0.30–0.70)

## 2016-04-24 LAB — TROPONIN I: Troponin I: <0.03 ng/mL (ref <0.03)

## 2016-04-24 LAB — CBC
HEMATOCRIT: 44.4 % (ref 39.0–52.0)
Hemoglobin: 14.6 g/dL (ref 13.0–17.0)
MCH: 31.1 pg (ref 26.0–34.0)
MCHC: 32.9 g/dL (ref 30.0–36.0)
MCV: 94.7 fL (ref 78.0–100.0)
PLATELETS: 193 10*3/uL (ref 150–400)
RBC: 4.69 MIL/uL (ref 4.22–5.81)
RDW: 12.9 % (ref 11.5–15.5)
WBC: 10.4 10*3/uL (ref 4.0–10.5)

## 2016-04-24 LAB — LIPID PANEL
CHOL/HDL RATIO: 3.9 ratio
Cholesterol: 110 mg/dL (ref 0–200)
HDL: 28 mg/dL — AB (ref >40)
LDL CALC: 59 mg/dL (ref 0–99)
Triglycerides: 114 mg/dL (ref <150)
VLDL: 23 mg/dL (ref 0–40)

## 2016-04-24 LAB — BASIC METABOLIC PANEL
Anion gap: 7 (ref 5–15)
BUN: 17 mg/dL (ref 6–20)
CALCIUM: 8.8 mg/dL — AB (ref 8.9–10.3)
CO2: 26 mmol/L (ref 22–32)
CREATININE: 0.64 mg/dL (ref 0.61–1.24)
Chloride: 106 mmol/L (ref 101–111)
Glucose, Bld: 119 mg/dL — ABNORMAL HIGH (ref 65–99)
Potassium: 3.7 mmol/L (ref 3.5–5.1)
SODIUM: 139 mmol/L (ref 135–145)

## 2016-04-24 MED ORDER — HEPARIN (PORCINE) IN NACL 100-0.45 UNIT/ML-% IJ SOLN
1050.0000 [IU]/h | INTRAMUSCULAR | Status: DC
  Administered 2016-04-24: 1050 [IU]/h via INTRAVENOUS
  Filled 2016-04-24: qty 250

## 2016-04-24 NOTE — Progress Notes (Signed)
ANTICOAGULATION CONSULT NOTE   Pharmacy Consult for Heparin Indication: chest pain/ACS  No Known Allergies  Patient Measurements: Height: 5\' 8"  (172.7 cm) Weight: 293 lb (132.904 kg) IBW/kg (Calculated) : 68.4 Heparin Dosing Weight: 100 kg  Vital Signs: Temp: 97.9 F (36.6 C) (07/01 2100) BP: 125/79 mmHg (07/01 2100) Pulse Rate: 67 (07/01 2100)  Labs:  Recent Labs  04/23/16 0826 04/23/16 0835 04/23/16 1318 04/23/16 1810 04/24/16 0025 04/24/16 0039  HGB 16.4 16.7  --   --   --  14.6  HCT 49.2 49.0  --   --   --  44.4  PLT 218  --   --   --   --  193  LABPROT  --   --  12.9  --   --   --   INR  --   --  0.95  --   --   --   HEPARINUNFRC  --   --   --  0.93*  --  0.72*  CREATININE  --  0.60*  --   --   --  0.64  TROPONINI  --   --  <0.03 <0.03 <0.03  --     Estimated Creatinine Clearance: 137.4 mL/min (by C-G formula based on Cr of 0.64).  Assessment: [age] YOM with hx CAD who [facility] with CP that radiated to the L-arm. Pt with history of abnormal stress test. Pt on heparin gtt. Heparin level still slightly supratherapeutic on 1250 units/hr. CBC stable. No bleeding noted.  Goal of Therapy:  Heparin level 0.3-0.7 units/ml Monitor platelets by anticoagulation protocol: Yes   Plan:  -reduce heparin infusion to 1150 units/hr -f/u 6 hr heparin level -plans for cath Monday  Christoper Fabianaron Eleanor Gatliff, PharmD, BCPS Clinical pharmacist, pager [phone] 04/24/2016 6:20 AM

## 2016-04-24 NOTE — Progress Notes (Signed)
    Subjective:  Denies CP or dyspnea   Objective:  Filed Vitals:   04/23/16 1200 04/23/16 1250 04/23/16 2100 04/24/16 0500  BP: 130/85 127/85 125/79 111/64  Pulse: 68 63 67 64  Temp:  98.2 F (36.8 C) 97.9 F (36.6 C) 98.2 F (36.8 C)  TempSrc:  Oral    Resp: 25 20 19 20   Height:      Weight:    281 lb 14.4 oz (127.869 kg)  SpO2: 96% 100% 96% 97%    Intake/Output from previous day:  Intake/Output Summary (Last 24 hours) at 04/24/16 0748 Last data filed at 04/24/16 [id number]  Gross per 24 hour  Intake 803.54 ml  Output    250 ml  Net 553.54 ml    Physical Exam: Physical exam: Well-developed obese in no acute distress.  Skin is warm and dry.  HEENT is normal.  Neck is supple.  Chest is clear to auscultation with normal expansion.  Cardiovascular exam is regular rate and rhythm.  Abdominal exam nontender or distended. No masses palpated. Extremities show no edema. neuro grossly intact    Lab Results: Basic Metabolic Panel:  Recent Labs  11/91/4705/11/09 0835 04/23/16 1318 04/24/16 0039  NA 141  --  139  K 4.1  --  3.7  CL 106  --  106  CO2  --   --  26  GLUCOSE 124*  --  119*  BUN 15  --  17  CREATININE 0.60*  --  0.64  CALCIUM  --   --  8.8*  MG  --  1.8  --    CBC:  Recent Labs  04/23/16 0826 04/23/16 0835 04/24/16 0039  WBC 7.3  --  10.4  HGB 16.4 16.7 14.6  HCT 49.2 49.0 44.4  MCV 94.4  --  94.7  PLT 218  --  193   Cardiac Enzymes:  Recent Labs  04/23/16 1318 04/23/16 1810 04/24/16 0025  TROPONINI <0.03 <0.03 <0.03     Assessment/Plan:  1 unstable angina-no further CP; enzymes negative. Recent nuclear study abnormal. Feel definitive evaluation is warranted. Plan cardiac catheterization tomorrow. The risks and benefits were discussed including myocardial infarction CVA and death. He agrees to proceed. Continue  aspirin, heparin, beta blocker and nitrates. 2 hypertension--continue preadmission medications. 3 hyperlipidemia-Continue  statin.  Olga MillersBrian Jahir Halt 04/24/2016, 7:48 AM

## 2016-04-24 NOTE — Progress Notes (Signed)
ANTICOAGULATION CONSULT NOTE   Pharmacy Consult for Heparin Indication: chest pain/ACS  No Known Allergies  Patient Measurements: Height: 5\' 8"  (172.7 cm) Weight: 281 lb 14.4 oz (127.869 kg) IBW/kg (Calculated) : 68.4 Heparin Dosing Weight: 100 kg  Vital Signs: Temp: 98.2 F (36.8 C) (07/02 1431) Temp Source: Oral (07/02 1431) BP: 127/68 mmHg (07/02 1431) Pulse Rate: 81 (07/02 1431)  Labs:  Recent Labs  04/23/16 0826 04/23/16 0835 04/23/16 1318  04/23/16 1810 04/24/16 0025 04/24/16 0039 04/24/16 1149 04/24/16 1934  HGB 16.4 16.7  --   --   --   --  14.6  --   --   HCT 49.2 49.0  --   --   --   --  44.4  --   --   PLT 218  --   --   --   --   --  193  --   --   LABPROT  --   --  12.9  --   --   --   --   --   --   INR  --   --  0.95  --   --   --   --   --   --   HEPARINUNFRC  --   --   --   < > 0.93*  --  0.72* 0.74* 0.52  CREATININE  --  0.60*  --   --   --   --  0.64  --   --   TROPONINI  --   --  <0.03  --  <0.03 <0.03  --   --   --   < > = values in this interval not displayed.  Estimated Creatinine Clearance: 134.5 mL/min (by C-G formula based on Cr of 0.64).  Assessment: [age] YOM with hx CAD [facility] with CP that radiated to the L-arm. Pt with history of abnormal stress test. Pt on heparin gtt 1050 units/hr. Level is now therapeutic   Goal of Therapy:  Heparin level 0.3-0.7 units/ml Monitor platelets by anticoagulation protocol: Yes   Plan:  -Continue heparin 1050 units/hr - Daily HL, CBC  Isaac BlissMichael Broox Lonigro, PharmD, BCPS, [facility] Clinical Pharmacist Pager [phone] 04/24/2016 8:21 PM

## 2016-04-24 NOTE — Progress Notes (Signed)
ANTICOAGULATION CONSULT NOTE   Pharmacy Consult for Heparin Indication: chest pain/ACS  No Known Allergies  Patient Measurements: Height: 5\' 8"  (172.7 cm) Weight: 281 lb 14.4 oz (127.869 kg) IBW/kg (Calculated) : 68.4 Heparin Dosing Weight: 100 kg  Vital Signs: Temp: 98.2 F (36.8 C) (07/02 0500) BP: 111/64 mmHg (07/02 0500) Pulse Rate: 64 (07/02 0500)  Labs:  Recent Labs  04/23/16 0826 04/23/16 0835 04/23/16 1318 04/23/16 1810 04/24/16 0025 04/24/16 0039 04/24/16 1149  HGB 16.4 16.7  --   --   --  14.6  --   HCT 49.2 49.0  --   --   --  44.4  --   PLT 218  --   --   --   --  193  --   LABPROT  --   --  12.9  --   --   --   --   INR  --   --  0.95  --   --   --   --   HEPARINUNFRC  --   --   --  0.93*  --  0.72* 0.74*  CREATININE  --  0.60*  --   --   --  0.64  --   TROPONINI  --   --  <0.03 <0.03 <0.03  --   --     Estimated Creatinine Clearance: 134.5 mL/min (by C-G formula based on Cr of 0.64).  Assessment: [age] YOM with hx CAD who [facility] with CP that radiated to the L-arm. Pt with history of abnormal stress test. Pt on heparin gtt. Heparin level still slightly supratherapeutic on 1150 units/hr although drawn ~ 1 hr early. CBC stable. No bleeding noted.  Goal of Therapy:  Heparin level 0.3-0.7 units/ml Monitor platelets by anticoagulation protocol: Yes   Plan:  -reduce heparin infusion to 1050 units/hr -f/u 6 hr heparin level -plans for cath Monday  Murel Shenberger K. Bonnye FavaNicolsen, PharmD, BCPS, CPP Clinical Pharmacist Pager: [phone] Phone: [phone] 04/24/2016 12:55 PM

## 2016-04-25 ENCOUNTER — Encounter (HOSPITAL_COMMUNITY): Payer: Self-pay | Admitting: Cardiology

## 2016-04-25 ENCOUNTER — Telehealth: Payer: Self-pay | Admitting: Cardiology

## 2016-04-25 ENCOUNTER — Encounter (HOSPITAL_COMMUNITY)
Admission: EM | Disposition: A | Discharge status: Home / Self Care | Payer: Self-pay | Source: Home / Self Care | Attending: Emergency Medicine

## 2016-04-25 DIAGNOSIS — E785 Hyperlipidemia, unspecified: Secondary | ICD-10-CM

## 2016-04-25 DIAGNOSIS — R931 Abnormal findings on diagnostic imaging of heart and coronary circulation: Secondary | ICD-10-CM | POA: Diagnosis not present

## 2016-04-25 DIAGNOSIS — I25110 Atherosclerotic heart disease of native coronary artery with unstable angina pectoris: Secondary | ICD-10-CM | POA: Diagnosis not present

## 2016-04-25 DIAGNOSIS — I200 Unstable angina: Secondary | ICD-10-CM | POA: Diagnosis not present

## 2016-04-25 DIAGNOSIS — I10 Essential (primary) hypertension: Secondary | ICD-10-CM

## 2016-04-25 HISTORY — PX: CARDIAC CATHETERIZATION: SHX172

## 2016-04-25 LAB — CBC
HEMATOCRIT: 44.4 % (ref 39.0–52.0)
HEMOGLOBIN: 14.4 g/dL (ref 13.0–17.0)
MCH: 30.8 pg (ref 26.0–34.0)
MCHC: 32.4 g/dL (ref 30.0–36.0)
MCV: 94.9 fL (ref 78.0–100.0)
Platelets: 182 10*3/uL (ref 150–400)
RBC: 4.68 MIL/uL (ref 4.22–5.81)
RDW: 13 % (ref 11.5–15.5)
WBC: 6.6 10*3/uL (ref 4.0–10.5)

## 2016-04-25 LAB — HEMOGLOBIN A1C
Hgb A1c MFr Bld: 6.2 % — ABNORMAL HIGH (ref 4.8–5.6)
Mean Plasma Glucose: 131 mg/dL

## 2016-04-25 LAB — HEPARIN LEVEL (UNFRACTIONATED): Heparin Unfractionated: 0.44 IU/mL (ref 0.30–0.70)

## 2016-04-25 SURGERY — LEFT HEART CATH AND CORONARY ANGIOGRAPHY
Anesthesia: LOCAL

## 2016-04-25 MED ORDER — HEPARIN (PORCINE) IN NACL 2-0.9 UNIT/ML-% IJ SOLN
INTRAMUSCULAR | Status: AC
  Filled 2016-04-25: qty 1000

## 2016-04-25 MED ORDER — VERAPAMIL HCL 2.5 MG/ML IV SOLN
INTRAVENOUS | Status: DC | PRN
  Administered 2016-04-25: 11:00:00 via INTRA_ARTERIAL

## 2016-04-25 MED ORDER — SODIUM CHLORIDE 0.9% FLUSH: 3.0000 mL | Freq: Two times a day (BID) | INTRAVENOUS | Status: DC

## 2016-04-25 MED ORDER — IOPAMIDOL (ISOVUE-370) INJECTION 76%
INTRAVENOUS | Status: DC | PRN
  Administered 2016-04-25: 100 mL via INTRA_ARTERIAL

## 2016-04-25 MED ORDER — SODIUM CHLORIDE 0.9% FLUSH: 3.0000 mL | INTRAVENOUS | Status: DC | PRN

## 2016-04-25 MED ORDER — HEPARIN SODIUM (PORCINE) 1000 UNIT/ML IJ SOLN
INTRAMUSCULAR | Status: DC | PRN
  Administered 2016-04-25: 6000 [IU] via INTRAVENOUS

## 2016-04-25 MED ORDER — SODIUM CHLORIDE 0.9 % WEIGHT BASED INFUSION: 1.0000 mL/kg/h | INTRAVENOUS | Status: AC

## 2016-04-25 MED ORDER — MIDAZOLAM HCL 2 MG/2ML IJ SOLN
INTRAMUSCULAR | Status: DC | PRN
  Administered 2016-04-25: 1 mg via INTRAVENOUS

## 2016-04-25 MED ORDER — SODIUM CHLORIDE 0.9 % WEIGHT BASED INFUSION: 1.0000 mL/kg/h | INTRAVENOUS | Status: DC

## 2016-04-25 MED ORDER — VERAPAMIL HCL 2.5 MG/ML IV SOLN
INTRAVENOUS | Status: AC
  Filled 2016-04-25: qty 2

## 2016-04-25 MED ORDER — IOPAMIDOL (ISOVUE-370) INJECTION 76%
INTRAVENOUS | Status: AC
  Filled 2016-04-25: qty 100

## 2016-04-25 MED ORDER — HEPARIN SODIUM (PORCINE) 1000 UNIT/ML IJ SOLN
INTRAMUSCULAR | Status: AC
  Filled 2016-04-25: qty 1

## 2016-04-25 MED ORDER — SODIUM CHLORIDE 0.9 % IV SOLN: 250.0000 mL | INTRAVENOUS | Status: DC | PRN

## 2016-04-25 MED ORDER — FENTANYL CITRATE (PF) 100 MCG/2ML IJ SOLN
INTRAMUSCULAR | Status: DC | PRN
  Administered 2016-04-25: 25 ug via INTRAVENOUS

## 2016-04-25 MED ORDER — LIDOCAINE HCL (PF) 1 % IJ SOLN
INTRAMUSCULAR | Status: AC
  Filled 2016-04-25: qty 30

## 2016-04-25 MED ORDER — ASPIRIN 81 MG PO CHEW
81.0000 mg | CHEWABLE_TABLET | ORAL | Status: AC
  Administered 2016-04-25: 81 mg via ORAL
  Filled 2016-04-25: qty 1

## 2016-04-25 MED ORDER — ASPIRIN EC 81 MG PO TBEC: 81.0000 mg | DELAYED_RELEASE_TABLET | Freq: Every day | ORAL | Status: DC

## 2016-04-25 MED ORDER — FENTANYL CITRATE (PF) 100 MCG/2ML IJ SOLN
INTRAMUSCULAR | Status: AC
  Filled 2016-04-25: qty 2

## 2016-04-25 MED ORDER — SODIUM CHLORIDE 0.9 % WEIGHT BASED INFUSION
3.0000 mL/kg/h | INTRAVENOUS | Status: DC
  Administered 2016-04-25: 3 mL/kg/h via INTRAVENOUS

## 2016-04-25 MED ORDER — LIDOCAINE HCL (PF) 1 % IJ SOLN
INTRAMUSCULAR | Status: DC | PRN
  Administered 2016-04-25: 11:00:00

## 2016-04-25 MED ORDER — MIDAZOLAM HCL 2 MG/2ML IJ SOLN
INTRAMUSCULAR | Status: AC
  Filled 2016-04-25: qty 2

## 2016-04-25 SURGICAL SUPPLY — 12 items
BAG SNAP BAND KOVER 36X36 (MISCELLANEOUS) ×2 IMPLANT
CATH INFINITI 5 FR JL3.5 (CATHETERS) ×2 IMPLANT
CATH INFINITI 5FR ANG PIGTAIL (CATHETERS) ×2 IMPLANT
CATH INFINITI JR4 5F (CATHETERS) ×2 IMPLANT
DEVICE RAD COMP TR BAND LRG (VASCULAR PRODUCTS) ×2 IMPLANT
GLIDESHEATH SLEND SS 6F .021 (SHEATH) ×2 IMPLANT
KIT HEART LEFT (KITS) ×2 IMPLANT
PACK CARDIAC CATHETERIZATION (CUSTOM PROCEDURE TRAY) ×2 IMPLANT
SYR MEDRAD MARK V 150ML (SYRINGE) ×2 IMPLANT
TRANSDUCER W/STOPCOCK (MISCELLANEOUS) ×2 IMPLANT
TUBING CIL FLEX 10 FLL-RA (TUBING) ×2 IMPLANT
WIRE SAFE-T 1.5MM-J .035X260CM (WIRE) ×2 IMPLANT

## 2016-04-25 NOTE — H&P (View-Only) (Signed)
    Subjective:  Denies CP or dyspnea   Objective:  Filed Vitals:   04/24/16 0500 04/24/16 1431 04/24/16 2100 04/25/16 0500  BP: 111/64 127/68 112/63 121/69  Pulse: 64 81 72 68  Temp: 98.2 F (36.8 C) 98.2 F (36.8 C) 98.2 F (36.8 C) 98.1 F (36.7 C)  TempSrc:  Oral    Resp: 20  19 18   Height:      Weight: 281 lb 14.4 oz (127.869 kg)   281 lb 9.6 oz (127.733 kg)  SpO2: 97% 96% 95% 97%    Intake/Output from previous day:  Intake/Output Summary (Last 24 hours) at 04/25/16 0747 Last data filed at 04/25/16 0500  Gross per 24 hour  Intake    120 ml  Output    950 ml  Net   -830 ml    Physical Exam: Physical exam: Well-developed obese in no acute distress.  Skin is warm and dry.  HEENT is normal.  Neck is supple.  Chest is clear to auscultation with normal expansion.  Cardiovascular exam is regular rate and rhythm.  Abdominal exam nontender or distended. No masses palpated. Extremities show no edema. Good radial pulses. neuro grossly intact    Lab Results: Basic Metabolic Panel:  Recent Labs  16/07/9606/01/17 0835 04/23/16 1318 04/24/16 0039  NA 141  --  139  K 4.1  --  3.7  CL 106  --  106  CO2  --   --  26  GLUCOSE 124*  --  119*  BUN 15  --  17  CREATININE 0.60*  --  0.64  CALCIUM  --   --  8.8*  MG  --  1.8  --    CBC:  Recent Labs  04/24/16 0039 04/25/16 0323  WBC 10.4 6.6  HGB 14.6 14.4  HCT 44.4 44.4  MCV 94.7 94.9  PLT 193 182   Cardiac Enzymes:  Recent Labs  04/23/16 1318 04/23/16 1810 04/24/16 0025  TROPONINI <0.03 <0.03 <0.03     Assessment/Plan:  1 unstable angina-no further CP; enzymes negative. Recent nuclear study abnormal. Feel definitive evaluation is warranted. Plan cardiac catheterization tomorrow. The risks and benefits were discussed including myocardial infarction CVA and death. He agrees to proceed. Continue  aspirin, heparin, beta blocker and nitrates. 2 hypertension--well controlled. 3 hyperlipidemia- excellent  control. Continue statin. 4. Morbid obesity.  Theron Aristaeter [facility] CfJordanMD,FACC 04/25/2016, 7:47 AM

## 2016-04-25 NOTE — Telephone Encounter (Signed)
FORWARD TO CHERYL LPN 

## 2016-04-25 NOTE — Discharge Summary (Signed)
Discharge Summary    Patient ID: Tim Heath Dudgeon,  MRN: [id number], DOB/AGE: 56/25/1962 56 y.o.  Admit date: 04/23/2016 Discharge date: 04/25/2016  Primary Care Provider: Johny BlamerHARRIS, WILLIAM Primary Cardiologist: Dr. SwazilandJordan  Discharge Diagnoses    Principal Problem:   Angina pectoris, crescendo [facility](HCC) Active Problems:   Hypertension   Coronary artery disease   Hyperlipidemia LDL goal <70   Abnormal nuclear cardiac imaging test   Angina at rest [facility](HCC)   History of Present Illness     Tim Heath Can is a 55 y.o. male with past medical history of CAD (s/Heath DES to RCA in 2003 and DES to RI in 2004), HTN, HLD, and morbid obesity who [facility] for evaluation of chest pain.   He reported going to work earlier that morning and having L hand tingling, which progressed to his left neck, shoulder, and into his chest.He did take NTGwhich provided some relief but his pain was still present. He was a little lightheaded and nauseated as well.Reported his symptoms were similar to when he required previous stents.   His initial troponin was negative and EKG showed no acute ischemic changes. Of note, he had a recent exercise Myoview on 04/19/2016 which was positive for a moderate size, mild severity, partially reversible inferoseptal and apical perfusion defects with EF 48%.The plan was for an outpatient cardiac catheterization.  He was started on Heparin with anticipation of a cardiac catheterization the following Monday, 04/25/2016.   Hospital Course     Consultants: None  He denied any repeat episodes of chest discomfort since admission. Cyclic troponin values were negative. His cardiac catheterization showed nonobstructive disease with patent stents in the ramus intermediate and RCA. LV function was normal. The full report is included below. It was recommended to consider alternatives of his discomfort, possibly of a cervical etiology. He has already arranged  follow-up with his PCP regarding this. He tolerated the procedure well and no complications were noted.  He was last examined by Dr. SwazilandJordan and deemed stable for discharge. He has routine scheduled follow-up in 06/2016. Was informed to call us sooner if needed. No changes were made to his medications. The patient was discharged home in stable condition.   ____________  Discharge Vitals Blood pressure 133/75, pulse 73, temperature 98.1 F (36.7 C), temperature source Oral, resp. rate 0, height 5\' 8"  (1.727 m), weight 281 lb 9.6 oz (127.733 kg), SpO2 94 %.  Filed Weights   04/23/16 0807 04/24/16 0500 04/25/16 0500  Weight: 293 lb (132.904 kg) 281 lb 14.4 oz (127.869 kg) 281 lb 9.6 oz (127.733 kg)    Labs & Radiologic Studies     CBC  Recent Labs  04/24/16 0039 04/25/16 0323  WBC 10.4 6.6  HGB 14.6 14.4  HCT 44.4 44.4  MCV 94.7 94.9  PLT 193 182   Basic Metabolic Panel  Recent Labs  04/23/16 0835 04/23/16 1318 04/24/16 0039  NA 141  --  139  K 4.1  --  3.7  CL 106  --  106  CO2  --   --  26  GLUCOSE 124*  --  119*  BUN 15  --  17  CREATININE 0.60*  --  0.64  CALCIUM  --   --  8.8*  MG  --  1.8  --    Liver Function Tests No results for input(s): AST, ALT, ALKPHOS, BILITOT, PROT, ALBUMIN in the last 72 hours. No results for input(s):  LIPASE, AMYLASE in the last 72 hours. Cardiac Enzymes  Recent Labs  04/23/16 1318 04/23/16 1810 04/24/16 0025  TROPONINI <0.03 <0.03 <0.03   BNP Invalid input(s): POCBNP D-Dimer No results for input(s): DDIMER in the last 72 hours. Hemoglobin A1C  Recent Labs  04/23/16 1318  HGBA1C 6.2*   Fasting Lipid Panel  Recent Labs  04/24/16 0057  CHOL 110  HDL 28*  LDLCALC 59  TRIG [id number]  CHOLHDL 3.9   Thyroid Function Tests  Recent Labs  04/23/16 1318  TSH 1.066    Dg Chest 2 View: 04/14/2016  CLINICAL DATA:  Chest pain, left arm pain starting this morning 6 a.m. EXAM: CHEST  2 VIEW COMPARISON:  01/17/2013 FINDINGS:  Cardiomediastinal silhouette is stable. There is linear atelectasis or scarring left base. No acute infiltrate or pulmonary edema. Bony thorax is unremarkable. IMPRESSION: No active cardiopulmonary disease. Electronically Signed   By: Natasha MeadLiviu  Pop M.D.   On: 04/14/2016 08:09    Diagnostic Studies/Procedures     Cardiac Catheterization: 04/25/2016  Mid RCA lesion, 35% stenosed. The lesion was previously treated with a drug-eluting stent greater than two years ago.  Mid RCA to Dist RCA lesion, 25% stenosed.  Prox LAD to Mid LAD lesion, 45% stenosed.  Ost 2nd Diag to 2nd Diag lesion, 60% stenosed.  The left ventricular systolic function is normal.  1. Nonobstructive CAD 2. Patent stents in the ramus intermediate and RCA 3. Normal LV function  Plan: continue medical therapy. No change in angiogram since 2014. Consider alternative causes for his discomfort. ? Cervical disease with left arm pain and numbness.     Disposition   Pt is being discharged home today in good condition.  Follow-up Plans & Appointments    Follow-up Information    Follow up with Peter SwazilandJordan, MD On 07/21/2016.   Specialty:  Cardiology   Why:  Cardiology Follow-Up on 07/21/2016 at 4:00PM.    Contact information:   [address] [place] [id number] [phone]      Discharge Instructions    Diet - low sodium heart healthy    Complete by:  As directed      Discharge instructions    Complete by:  As directed   PLEASE REMEMBER TO BRING ALL OF YOUR MEDICATIONS TO EACH OF YOUR FOLLOW-UP OFFICE VISITS.  PLEASE ATTEND ALL SCHEDULED FOLLOW-UP APPOINTMENTS.   Activity: Increase activity slowly as tolerated. You may shower, but no soaking baths (or swimming) for 1 week. No driving for 24 hours. No lifting over 5 lbs for 1 week. No sexual activity for 1 week.   You May Return to Work: in 1 week (if applicable)  Wound Care: You may wash cath site gently with soap and water. Keep cath site clean and  dry. If you notice pain, swelling, bleeding or pus at your cath site, please call [phone].     Increase activity slowly    Complete by:  As directed            Discharge Medications   Current Discharge Medication List    CONTINUE these medications which have NOT CHANGED   Details  aspirin 81 MG tablet Take 81 mg by mouth daily.      atorvastatin (LIPITOR) 80 MG tablet Take 1 tablet (80 mg total) by mouth daily. Qty: 90 tablet, Refills: 2    clopidogrel (PLAVIX) 75 MG tablet Take 1 tablet (75 mg total) by mouth daily. Qty: 90 tablet, Refills: 2  Glucosamine-Chondroitin (OSTEO BI-FLEX REGULAR STRENGTH PO) Take 2 tablets by mouth daily.    lisinopril (PRINIVIL,ZESTRIL) 20 MG tablet TAKE ONE TABLET BY MOUTH ONCE DAILY Qty: 30 tablet, Refills: 4    metoprolol succinate (TOPROL-XL) 50 MG 24 hr tablet Take 1 tablet (50 mg total) by mouth daily. Qty: 90 tablet, Refills: 2    Multiple Vitamin (MULTIVITAMIN) tablet Take 1 tablet by mouth daily.      nitroGLYCERIN (NITROSTAT) 0.4 MG SL tablet Place 1 tablet (0.4 mg total) under the tongue every 5 (five) minutes as needed for chest pain. Qty: 100 tablet, Refills: 0    omeprazole (PRILOSEC) 20 MG capsule Take 1 capsule (20 mg total) by mouth daily. Qty: 90 capsule, Refills: 3    tamsulosin (FLOMAX) 0.4 MG CAPS capsule Take 1 capsule (0.4 mg total) by mouth daily. Qty: 10 capsule, Refills: 0         Allergies No Known Allergies   Outstanding Labs/Studies   None  Duration of Discharge Encounter   Greater than 30 minutes including physician time.  Signed, Ellsworth Lennox, PA-C 04/25/2016, 2:56 PM

## 2016-04-25 NOTE — Telephone Encounter (Signed)
New message     The wife is calling the pt is already scheduled for the cath by  Dr. SwazilandJordan so the office doesn't need to keep calling to set-up  Anything. No other information provided.

## 2016-04-25 NOTE — Progress Notes (Signed)
    Subjective:  Denies CP or dyspnea   Objective:  Filed Vitals:   04/24/16 0500 04/24/16 1431 04/24/16 2100 04/25/16 0500  BP: 111/64 127/68 112/63 121/69  Pulse: 64 81 72 68  Temp: 98.2 F (36.8 C) 98.2 F (36.8 C) 98.2 F (36.8 C) 98.1 F (36.7 C)  TempSrc:  Oral    Resp: 20  19 18  Height:      Weight: 281 lb 14.4 oz (127.869 kg)   281 lb 9.6 oz (127.733 kg)  SpO2: 97% 96% 95% 97%    Intake/Output from previous day:  Intake/Output Summary (Last 24 hours) at 04/25/16 0747 Last data filed at 04/25/16 0500  Gross per 24 hour  Intake    120 ml  Output    950 ml  Net   -830 ml    Physical Exam: Physical exam: Well-developed obese in no acute distress.  Skin is warm and dry.  HEENT is normal.  Neck is supple.  Chest is clear to auscultation with normal expansion.  Cardiovascular exam is regular rate and rhythm.  Abdominal exam nontender or distended. No masses palpated. Extremities show no edema. Good radial pulses. neuro grossly intact    Lab Results: Basic Metabolic Panel:  Recent Labs  04/23/16 0835 04/23/16 1318 04/24/16 0039  NA 141  --  139  K 4.1  --  3.7  CL 106  --  106  CO2  --   --  26  GLUCOSE 124*  --  119*  BUN 15  --  17  CREATININE 0.60*  --  0.64  CALCIUM  --   --  8.8*  MG  --  1.8  --    CBC:  Recent Labs  04/24/16 0039 04/25/16 0323  WBC 10.4 6.6  HGB 14.6 14.4  HCT 44.4 44.4  MCV 94.7 94.9  PLT 193 182   Cardiac Enzymes:  Recent Labs  04/23/16 1318 04/23/16 1810 04/24/16 0025  TROPONINI <0.03 <0.03 <0.03     Assessment/Plan:  1 unstable angina-no further CP; enzymes negative. Recent nuclear study abnormal. Feel definitive evaluation is warranted. Plan cardiac catheterization tomorrow. The risks and benefits were discussed including myocardial infarction CVA and death. He agrees to proceed. Continue  aspirin, heparin, beta blocker and nitrates. 2 hypertension--well controlled. 3 hyperlipidemia- excellent  control. Continue statin. 4. Morbid obesity.  Santanna Whitford JordanMD,FACC 04/25/2016, 7:47 AM    

## 2016-04-25 NOTE — Interval H&P Note (Signed)
History and Physical Interval Note:  04/25/2016 10:49 AM  Tim Heath  has presented today for surgery, with the diagnosis of unstable angina  The various methods of treatment have been discussed with the patient and family. After consideration of risks, benefits and other options for treatment, the patient has consented to  Procedure(s): Left Heart Cath and Coronary Angiography (N/A) as a surgical intervention .  The patient's history has been reviewed, patient examined, no change in status, stable for surgery.  I have reviewed the patient's chart and labs.  Questions were answered to the patient's satisfaction.   Cath Lab Visit (complete for each Cath Lab visit)  Clinical Evaluation Leading to the Procedure:   ACS: Yes.    Non-ACS:    Anginal Classification: CCS III  Anti-ischemic medical therapy: Maximal Therapy (2 or more classes of medications)  Non-Invasive Test Results: Intermediate-risk stress test findings: cardiac mortality 1-3%/year  Prior CABG: No previous CABG        Theron Aristaeter [facility] HospitalJordanMD,FACC 04/25/2016 10:49 AM

## 2016-04-25 NOTE — Telephone Encounter (Signed)
Returned call to patient's wife.She stated husband having a cath now.Appointment with Cline CrockKathryn Thompson PA 04/28/16 to discuss cath cancelled.

## 2016-04-25 NOTE — Progress Notes (Signed)
ANTICOAGULATION CONSULT NOTE   Pharmacy Consult:  Heparin Indication: chest pain/ACS  No Known Allergies  Patient Measurements: Height: 5\' 8"  (172.7 cm) Weight: 281 lb 9.6 oz (127.733 kg) IBW/kg (Calculated) : 68.4 Heparin Dosing Weight: 100 kg  Vital Signs: Temp: 98.1 F (36.7 C) (07/03 0500) BP: 121/69 mmHg (07/03 0500) Pulse Rate: 68 (07/03 0500)  Labs:  Recent Labs  04/23/16 0826 04/23/16 0835 04/23/16 1318  04/23/16 1810 04/24/16 0025 04/24/16 0039 04/24/16 1149 04/24/16 1934 04/25/16 0323  HGB 16.4 16.7  --   --   --   --  14.6  --   --  14.4  HCT 49.2 49.0  --   --   --   --  44.4  --   --  44.4  PLT 218  --   --   --   --   --  193  --   --  182  LABPROT  --   --  12.9  --   --   --   --   --   --   --   INR  --   --  0.95  --   --   --   --   --   --   --   HEPARINUNFRC  --   --   --   < > 0.93*  --  0.72* 0.74* 0.52 0.44  CREATININE  --  0.60*  --   --   --   --  0.64  --   --   --   TROPONINI  --   --  <0.03  --  <0.03 <0.03  --   --   --   --   < > = values in this interval not displayed.  Estimated Creatinine Clearance: 134.3 mL/min (by C-G formula based on Cr of 0.64).   Assessment: [age] YOM with history of CAD presented to the [facility] on 04/23/16 with CP that radiated to the left arm.  Patient continues on IV heparin for UA with plan for cath today.  Heparin level is therapeutic; no bleeding reported.   Goal of Therapy:  Heparin level 0.3-0.7 units/ml Monitor platelets by anticoagulation protocol: Yes    Plan:  - Continue heparin gtt at 1050 units/hr - Daily HL, CBC - F/U post cath   Tory Septer D. Laney Potashang, PharmD, BCPS Pager:  [phone] 04/25/2016, 7:52 AM

## 2016-04-27 ENCOUNTER — Ambulatory Visit (HOSPITAL_COMMUNITY): Payer: Managed Care, Other (non HMO)

## 2016-04-28 ENCOUNTER — Ambulatory Visit: Payer: Managed Care, Other (non HMO) | Admitting: Physician Assistant

## 2016-04-28 ENCOUNTER — Ambulatory Visit (HOSPITAL_COMMUNITY): Payer: Managed Care, Other (non HMO)

## 2016-06-14 ENCOUNTER — Telehealth: Payer: Self-pay | Admitting: Cardiology

## 2016-06-14 NOTE — Telephone Encounter (Signed)
He is cleared for colonoscopy and may hold Plavix for procedure 5-7 days.  Peter SwazilandJordan MD, [facility]ACC

## 2016-06-14 NOTE — Telephone Encounter (Signed)
Request for surgical clearance:  1. What type of surgery is being performed?Colonoscopy   2. When is this surgery scheduled? 06-21-16   3. Are there any medications that need to be held prior to surgery and how long?Can pt his Plavix today?-need to know this asap   4. Name of physician performing surgery? Dr Dorena CookeyJohn Hayes  5. What is your office phone and fax number? [phone] and [phone] -Please let them know asap

## 2016-06-14 NOTE — Telephone Encounter (Signed)
Called medical office and communicated advice to receptionist who informed me she will relay this to caller. I gave direct line for triage desk here so that they may call back acknowledging receipt of information.

## 2016-06-14 NOTE — Telephone Encounter (Signed)
Spoke to Cusickourtney who confirmed instructions for patient and will communicate these to him.

## 2016-06-14 NOTE — Telephone Encounter (Signed)
Note: I have made attempt to reach patient and his wife (DPR listed) at their available numbers. No means to leave VM message to inform of instructions.

## 2016-06-14 NOTE — Telephone Encounter (Signed)
Routed clearance request to provider.

## 2016-06-21 ENCOUNTER — Other Ambulatory Visit: Payer: Self-pay | Admitting: Gastroenterology

## 2016-07-06 ENCOUNTER — Encounter: Payer: Self-pay | Admitting: Cardiology

## 2016-07-20 NOTE — Progress Notes (Signed)
Tim Heath Date of Birth: Mar 27, 1961   History of Present Illness: Tim Heath is seen today for followup of CAD. He has a history of coronary disease with stenting of the right coronary in 2003 with a Cypher stent. He had stenting of the ramus intermediate branch in 2004 with a Taxus stent. Cardiac catheterization in March 2014 showed nonobstructive disease. In July of 2017 he presented with chest pain and ruled out for MI. A Myoview study was felt to be intermediate risk as noted below. He underwent cardiac cath which showed nonobstructive CAD and patent stents in the RCA and ramus intermediate vessels.  On follow up today he is having no chest pain or SOB. These symptoms have all resolved. No arm pain. He did undergo kidney stone extraction in September.   Current Outpatient Prescriptions on File Prior to Visit  Medication Sig Dispense Refill  . aspirin 81 MG tablet Take 81 mg by mouth daily.      Marland Kitchen atorvastatin (LIPITOR) 80 MG tablet Take 1 tablet (80 mg total) by mouth daily. 90 tablet 2  . clopidogrel (PLAVIX) 75 MG tablet Take 1 tablet (75 mg total) by mouth daily. 90 tablet 2  . Glucosamine-Chondroitin (OSTEO BI-FLEX REGULAR STRENGTH PO) Take 2 tablets by mouth daily.    Marland Kitchen lisinopril (PRINIVIL,ZESTRIL) 20 MG tablet TAKE ONE TABLET BY MOUTH ONCE DAILY 30 tablet 4  . metoprolol succinate (TOPROL-XL) 50 MG 24 hr tablet Take 1 tablet (50 mg total) by mouth daily. 90 tablet 2  . Multiple Vitamin (MULTIVITAMIN) tablet Take 1 tablet by mouth daily.      . nitroGLYCERIN (NITROSTAT) 0.4 MG SL tablet Place 1 tablet (0.4 mg total) under the tongue every 5 (five) minutes as needed for chest pain. 100 tablet 0  . omeprazole (PRILOSEC) 20 MG capsule Take 1 capsule (20 mg total) by mouth daily. 90 capsule 3   No current facility-administered medications on file prior to visit.     No Known Allergies  Past Medical History:  Diagnosis Date  . Coronary artery disease    a. DES-RCA 2003 b. DES-RI  2004/ normal LVF c. cath 04/2016: nonobstructive CAD w/ patent stents in RI and RCA.   Marland Kitchen GERD (gastroesophageal reflux disease)   . History of Bell's palsy   . History of hiatal hernia   . Hyperlipidemia   . Hypertension   . Left ureteral stone     Past Surgical History:  Procedure Laterality Date  . CARDIAC CATHETERIZATION  04-12-2004  &  01-18-2013  dr Swaziland   Non-obstructive CAD/  continued patency of stents fo RCA and Intermediate branch/  nomral LVF, ef 55-65%  . CARDIAC CATHETERIZATION N/A 04/25/2016   Procedure: Left Heart Cath and Coronary Angiography;  Surgeon: Louvina Cleary M Swaziland, MD;  Location: [facility] INVASIVE CV LAB;  Service: Cardiovascular;  Laterality: N/A;  . CARDIOVASCULAR STRESS TEST  last one 06-06-2011   dr Swaziland   normal perfusion study/  no ischemia or scar/  normal LV function and wall motion, ef 66%  . CORONARY ANGIOPLASTY WITH STENT PLACEMENT  07-30-2002  dr Swaziland   abnormal cardiolite (inferior wall ischemia)  single-vessel CAD ,  balloon dilation and DES to RCA/  mild disease LAD 30-40%, non-obstructive/  perserved LV, ef 65%  . CORONARY ANGIOPLASTY WITH STENT PLACEMENT  05-16-2003  dr Swaziland   DES x1 to Intermediate branch (90%)/  pLAD 40-50%,  mD1  50%,  patent RCA stent/  normal LVF  . CYSTOSCOPY/URETEROSCOPY/HOLMIUM LASER/STENT  PLACEMENT Left 04/28/2015   Procedure: CYSTOSCOPY/LEFT RETROGRADE LEFT URETEROSCOPY/STENT PLACEMENT/BLADDER BIOPSY WITH FULGERATION;  Surgeon: Jerilee Field, MD;  Location: [facility];  Service: Urology;  Laterality: Left;  . LAPAROSCOPIC CHOLECYSTECTOMY  04-01-2003  . TONSILLECTOMY      History  Smoking Status  . Former Smoker  . Quit date: 03/29/1986  Smokeless Tobacco  . Never Used    History  Alcohol Use No    Family History  Problem Relation Age of Onset  . Liver cancer Mother   . Emphysema Father   . Hypertension Brother   . Coronary artery disease Brother     Review of Systems: As noted in history of  present illness. All other systems were reviewed and are negative.  Physical Exam: BP (!) 147/91   Pulse 79   Ht 5\' 8"  (1.727 m)   Wt 282 lb (127.9 kg)   SpO2 97%   BMI 42.88 kg/m  He is an obese white male in no acute distress. HEENT is normal. Neck is supple no JVD, adenopathy, thyromegaly, or bruits. Lungs are clear. Cardiac exam reveals a regular rate and rhythm without gallop, murmur, or click. Abdomen is obese, soft, nontender. He has trace edema. Pedal pulses are good. Skin is warm and dry. He is alert and oriented x3. Cranial nerves II through XII are intact. There is no facial droop.  LABORATORY DATA:   Lab Results  Component Value Date   WBC 6.6 04/25/2016   HGB 14.4 04/25/2016   HCT 44.4 04/25/2016   PLT 182 04/25/2016   GLUCOSE 119 (H) 04/24/2016   CHOL 110 04/24/2016   TRIG 114 04/24/2016   HDL 28 (L) 04/24/2016   LDLCALC 59 04/24/2016   ALT 51 (H) 11/23/2015   AST 31 11/23/2015   NA 139 04/24/2016   K 3.7 04/24/2016   CL 106 04/24/2016   CREATININE 0.64 04/24/2016   BUN 17 04/24/2016   CO2 26 04/24/2016   TSH 1.066 04/23/2016   INR 0.95 04/23/2016   HGBA1C 6.2 (H) 04/23/2016     Myoview 04/21/16: Study Highlights    Nuclear stress EF: 48%.  No T wave inversion was noted during stress.  There was no ST segment deviation noted during stress.  Defect 1: There is a medium defect of mild severity.  This is an intermediate risk study.   Moderate size, mild severity partially reversible inferoseptal and apical perfusion defects which could be artifact or scar. LVEF 48% with inferoseptal dyskinesis. This is an intermediate risk study. Clinical correlation is advised.    Cardiac cath 04/25/16: Conclusion    Mid RCA lesion, 35% stenosed. The lesion was previously treated with a drug-eluting stent greater than two years ago.  Mid RCA to Dist RCA lesion, 25% stenosed.  Prox LAD to Mid LAD lesion, 45% stenosed.  Ost 2nd Diag to 2nd Diag lesion, 60%  stenosed.  The left ventricular systolic function is normal.   1. Nonobstructive CAD 2. Patent stents in the ramus intermediate and RCA 3. Normal LV function  Plan: continue medical therapy. No change in angiogram since 2014. Consider alternative causes for his discomfort. ? Cervical disease with left arm pain and numbness.       Assessment / Plan: 1. Coronary disease status post stenting procedures as noted above.  Cardiac catheterization in July 2017 showed nonobstructive disease.  He is currently asymptomatic. Continue risk factor modification.  2. Morbid obesity. Encouraged continued weight loss.   3. Hyperlipidemia. On high-dose statin  therapy.   4. Hypertension-BP is fairly well controlled today.

## 2016-07-21 ENCOUNTER — Ambulatory Visit (INDEPENDENT_AMBULATORY_CARE_PROVIDER_SITE_OTHER): Payer: Managed Care, Other (non HMO) | Admitting: Cardiology

## 2016-07-21 ENCOUNTER — Encounter: Payer: Self-pay | Admitting: Cardiology

## 2016-07-21 VITALS — BP 147/91 | HR 79 | Ht 68.0 in | Wt 282.0 lb

## 2016-07-21 DIAGNOSIS — I10 Essential (primary) hypertension: Secondary | ICD-10-CM

## 2016-07-21 DIAGNOSIS — E785 Hyperlipidemia, unspecified: Secondary | ICD-10-CM | POA: Diagnosis not present

## 2016-07-21 DIAGNOSIS — I25110 Atherosclerotic heart disease of native coronary artery with unstable angina pectoris: Secondary | ICD-10-CM | POA: Diagnosis not present

## 2016-07-21 NOTE — Patient Instructions (Signed)
Continue your current therapy  I will see you in 6 months.   

## 2016-07-25 ENCOUNTER — Other Ambulatory Visit: Payer: Self-pay | Admitting: Cardiology

## 2016-08-30 ENCOUNTER — Other Ambulatory Visit: Payer: Self-pay

## 2016-08-30 MED ORDER — CLOPIDOGREL BISULFATE 75 MG PO TABS: 75.0000 mg | ORAL_TABLET | Freq: Every day | ORAL | 3 refills | Status: DC

## 2016-08-30 MED ORDER — ATORVASTATIN CALCIUM 80 MG PO TABS: 80.0000 mg | ORAL_TABLET | Freq: Every day | ORAL | 3 refills | Status: DC

## 2016-08-30 MED ORDER — METOPROLOL SUCCINATE ER 50 MG PO TB24: 50.0000 mg | ORAL_TABLET | Freq: Every day | ORAL | 3 refills | Status: DC

## 2017-01-02 ENCOUNTER — Other Ambulatory Visit: Payer: Self-pay | Admitting: Cardiology

## 2017-01-03 NOTE — Telephone Encounter (Signed)
Rx(s) sent to pharmacy electronically.  

## 2017-01-05 NOTE — Progress Notes (Signed)
Tim Heath Date of Birth: 1961/06/19   History of Present Illness: Tim Heath is seen today for followup of CAD. He has a history of coronary disease with stenting of the right coronary in 2003 with a Cypher stent. He had stenting of the ramus intermediate branch in 2004 with a Taxus stent. Cardiac catheterization in March 2014 showed nonobstructive disease. In July of 2017 he presented with chest pain and ruled out for MI. A Myoview study was felt to be intermediate risk as noted below. He underwent cardiac cath which showed nonobstructive CAD and patent stents in the RCA and ramus intermediate vessels.  On follow up today he is having no chest pain or SOB.  No arm pain. He reports that he gained a lot of weight- 10 lbs and when seen by Dr. Tiburcio Heath was told he was diabetic and started on metformin. Since then he has really changed his diet- no sweets. Planning to start exercising more with walking.   Current Outpatient Prescriptions on File Prior to Visit  Medication Sig Dispense Refill  . aspirin 81 MG tablet Take 81 mg by mouth daily.      Marland Kitchen. atorvastatin (LIPITOR) 80 MG tablet Take 1 tablet (80 mg total) by mouth daily. 90 tablet 3  . clopidogrel (PLAVIX) 75 MG tablet Take 1 tablet (75 mg total) by mouth daily. 90 tablet 3  . Glucosamine-Chondroitin (OSTEO BI-FLEX REGULAR STRENGTH PO) Take 2 tablets by mouth daily.    Marland Kitchen. lisinopril (PRINIVIL,ZESTRIL) 20 MG tablet TAKE ONE TABLET BY MOUTH ONCE DAILY 90 tablet 2  . metoprolol succinate (TOPROL-XL) 50 MG 24 hr tablet Take 1 tablet (50 mg total) by mouth daily. 90 tablet 3  . Multiple Vitamin (MULTIVITAMIN) tablet Take 1 tablet by mouth daily.      . nitroGLYCERIN (NITROSTAT) 0.4 MG SL tablet Place 1 tablet (0.4 mg total) under the tongue every 5 (five) minutes as needed for chest pain. 100 tablet 0  . omeprazole (PRILOSEC) 20 MG capsule Take 1 capsule (20 mg total) by mouth daily. 90 capsule 3   No current facility-administered medications on  file prior to visit.     No Known Allergies  Past Medical History:  Diagnosis Date  . Coronary artery disease    a. DES-RCA 2003 b. DES-RI 2004/ normal LVF c. cath 04/2016: nonobstructive CAD w/ patent stents in RI and RCA.   Marland Kitchen. GERD (gastroesophageal reflux disease)   . History of Bell's palsy   . History of hiatal hernia   . Hyperlipidemia   . Hypertension   . Left ureteral stone     Past Surgical History:  Procedure Laterality Date  . CARDIAC CATHETERIZATION  04-12-2004  &  01-18-2013  dr Tim Heath   Non-obstructive CAD/  continued patency of stents fo RCA and Intermediate branch/  nomral LVF, ef 55-65%  . CARDIAC CATHETERIZATION N/A 04/25/2016   Procedure: Left Heart Cath and Coronary Angiography;  Surgeon: Tim Kreis M [place], MD;  [facility];  Service: Cardiovascular;  Laterality: N/A;  . CARDIOVASCULAR STRESS TEST  last one 06-06-2011   dr Tim Heath   normal perfusion study/  no ischemia or scar/  normal LV function and wall motion, ef 66%  . CORONARY ANGIOPLASTY WITH STENT PLACEMENT  07-30-2002  dr Tim Heath   abnormal cardiolite (inferior wall ischemia)  single-vessel CAD ,  balloon dilation and DES to RCA/  mild disease LAD 30-40%, non-obstructive/  perserved LV, ef 65%  . CORONARY ANGIOPLASTY WITH STENT PLACEMENT  05-16-2003  dr [place]   DES x1 to Intermediate branch (90%)/  pLAD 40-50%,  mD1  50%,  patent RCA stent/  normal LVF  . CYSTOSCOPY/URETEROSCOPY/HOLMIUM LASER/STENT PLACEMENT Left 04/28/2015   Procedure: CYSTOSCOPY/LEFT RETROGRADE LEFT URETEROSCOPY/STENT PLACEMENT/BLADDER BIOPSY WITH FULGERATION;  Surgeon: Tim Field, MD;  Location: [facility];  Service: Urology;  Laterality: Left;  . LAPAROSCOPIC CHOLECYSTECTOMY  04-01-2003  . TONSILLECTOMY      History  Smoking Status  . Former Smoker  . Quit date: 03/29/1986  Smokeless Tobacco  . Never Used    History  Alcohol Use No    Family History  Problem Relation Age of Onset  . Liver  cancer Mother   . Emphysema Father   . Hypertension Brother   . Coronary artery disease Brother     Review of Systems: As noted in history of present illness. All other systems were reviewed and are negative.  Physical Exam: BP 129/83   Pulse 78   Ht 5\' 8"  (1.727 m)   Wt 291 lb 6.4 oz (132.2 kg)   SpO2 94%   BMI 44.31 kg/m  He is an obese white male in no acute distress. HEENT is normal. Neck is supple no JVD, adenopathy, thyromegaly, or bruits. Lungs are clear. Cardiac exam reveals a regular rate and rhythm without gallop, murmur, or click. Abdomen is obese, soft, nontender. He has trace edema. Pedal pulses are good. Skin is warm and dry. He is alert and oriented x3. Cranial nerves II through XII are intact. There is no facial droop.  LABORATORY DATA:   Lab Results  Component Value Date   WBC 6.6 04/25/2016   HGB 14.4 04/25/2016   HCT 44.4 04/25/2016   PLT 182 04/25/2016   GLUCOSE 119 (H) 04/24/2016   CHOL 110 04/24/2016   TRIG 114 04/24/2016   HDL 28 (L) 04/24/2016   LDLCALC 59 04/24/2016   ALT 51 (H) 11/23/2015   AST 31 11/23/2015   NA 139 04/24/2016   K 3.7 04/24/2016   CL 106 04/24/2016   CREATININE 0.64 04/24/2016   BUN 17 04/24/2016   CO2 26 04/24/2016   TSH 1.066 04/23/2016   INR 0.95 04/23/2016   HGBA1C 6.2 (H) 04/23/2016   Labs reviewed from primary care: A1c 6.5%. Cholesterol 137, triglycerides 52, HDL 34, LDL 92. Glucose 124.     Assessment / Plan: 1. Coronary disease status post stenting procedures as noted above.  Cardiac catheterization in July 2017 showed nonobstructive disease.  He is currently asymptomatic. Continue risk factor modification.  2. Morbid obesity. Encouraged continued weight loss.   3. Hyperlipidemia. On high-dose statin therapy. Recent lipid panel indicates significant increase with cholesterol 110>>137 and LDL 59>>92. This reflects his weight gain and poor diet since his medication is unchanged. This does represent residual risk.  Focus on dietary modification and weight loss. If lipids do not improve would consider adding Zetia +/- PCSK 9 inhibitor.   4. Hypertension-BP is  well controlled today. Continue current therapy  5. DM type 2. On metformin now. Per primary care.

## 2017-01-09 ENCOUNTER — Encounter: Payer: Self-pay | Admitting: Cardiology

## 2017-01-09 ENCOUNTER — Ambulatory Visit (INDEPENDENT_AMBULATORY_CARE_PROVIDER_SITE_OTHER): Payer: Managed Care, Other (non HMO) | Admitting: Cardiology

## 2017-01-09 VITALS — BP 129/83 | HR 78 | Ht 68.0 in | Wt 291.4 lb

## 2017-01-09 DIAGNOSIS — E785 Hyperlipidemia, unspecified: Secondary | ICD-10-CM

## 2017-01-09 DIAGNOSIS — I25110 Atherosclerotic heart disease of native coronary artery with unstable angina pectoris: Secondary | ICD-10-CM

## 2017-01-09 DIAGNOSIS — E118 Type 2 diabetes mellitus with unspecified complications: Secondary | ICD-10-CM | POA: Insufficient documentation

## 2017-01-09 DIAGNOSIS — I10 Essential (primary) hypertension: Secondary | ICD-10-CM | POA: Diagnosis not present

## 2017-01-09 NOTE — Patient Instructions (Signed)
Continue your current therapy  Focus on weight loss and regular aerobic exercise  I will see you in 6 months

## 2017-07-26 ENCOUNTER — Telehealth: Payer: Self-pay | Admitting: Cardiology

## 2017-07-26 NOTE — Progress Notes (Signed)
Tim Heath Date of Birth: [birth year]   History of Present Illness: Tim Heath is seen today for followup of CAD. He has a history of coronary disease with stenting of the right coronary in 2003 with a Cypher stent. He had stenting of the ramus intermediate branch in 2004 with a Taxus stent. Cardiac catheterization in March 2014 showed nonobstructive disease. In July of 2017 he presented with chest pain and ruled out for MI. A Myoview study was felt to be intermediate risk. He underwent cardiac cath which showed nonobstructive CAD and patent stents in the RCA and ramus intermediate vessels.  On follow up today he is doing very well. He changed his eating habits and has lost 54 lbs. Planning on losing more. He denies any chest pain or SOB.  No arm pain. Blood sugars are improved and are now in the 90s.  He reports his energy is much better and he is more active. He did have a kidney stone that passed. When he saw urology his HR was in the 40s and he held metoprolol for a couple of days. Reports BP at home is typically in the 120s systolic.   Current Outpatient Prescriptions on File Prior to Visit  Medication Sig Dispense Refill  . aspirin 81 MG tablet Take 81 mg by mouth daily.      Marland Kitchen atorvastatin (LIPITOR) 80 MG tablet Take 1 tablet (80 mg total) by mouth daily. 90 tablet 3  . clopidogrel (PLAVIX) 75 MG tablet Take 1 tablet (75 mg total) by mouth daily. 90 tablet 3  . Glucosamine-Chondroitin (OSTEO BI-FLEX REGULAR STRENGTH PO) Take 2 tablets by mouth daily.    Marland Kitchen lisinopril (PRINIVIL,ZESTRIL) 20 MG tablet TAKE ONE TABLET BY MOUTH ONCE DAILY 90 tablet 2  . metFORMIN (GLUCOPHAGE) 500 MG tablet Take 500 mg by mouth daily.    . metoprolol succinate (TOPROL-XL) 50 MG 24 hr tablet Take 1 tablet (50 mg total) by mouth daily. 90 tablet 3  . Multiple Vitamin (MULTIVITAMIN) tablet Take 1 tablet by mouth daily.      . nitroGLYCERIN (NITROSTAT) 0.4 MG SL tablet Place 1 tablet (0.4 mg total) under the tongue  every 5 (five) minutes as needed for chest pain. 100 tablet 0  . omeprazole (PRILOSEC) 20 MG capsule Take 1 capsule (20 mg total) by mouth daily. 90 capsule 3   No current facility-administered medications on file prior to visit.     No Known Allergies  Past Medical History:  Diagnosis Date  . Coronary artery disease    a. DES-RCA 2003 b. DES-RI 2004/ normal LVF c. cath 04/2016: nonobstructive CAD w/ patent stents in RI and RCA.   Marland Kitchen GERD (gastroesophageal reflux disease)   . History of Bell's palsy   . History of hiatal hernia   . Hyperlipidemia   . Hypertension   . Left ureteral stone     Past Surgical History:  Procedure Laterality Date  . CARDIAC CATHETERIZATION  04-12-2004  &  01-18-2013  dr [place]   Non-obstructive CAD/  continued patency of stents fo RCA and Intermediate branch/  nomral LVF, ef 55-65%  . CARDIAC CATHETERIZATION N/A 04/25/2016   Procedure: Left Heart Cath and Coronary Angiography;  Surgeon: Shyanne Mcclary M [place], MD;  Location: [facility] INVASIVE CV LAB;  Service: Cardiovascular;  Laterality: N/A;  . CARDIOVASCULAR STRESS TEST  last one 06-06-2011   dr [place]   normal perfusion study/  no ischemia or scar/  normal LV function and wall motion, ef 66%  .  CORONARY ANGIOPLASTY WITH STENT PLACEMENT  07-30-2002  dr [place]   abnormal cardiolite (inferior wall ischemia)  single-vessel CAD ,  balloon dilation and DES to RCA/  mild disease LAD 30-40%, non-obstructive/  perserved LV, ef 65%  . CORONARY ANGIOPLASTY WITH STENT PLACEMENT  05-16-2003  dr [place]   DES x1 to Intermediate branch (90%)/  pLAD 40-50%,  mD1  50%,  patent RCA stent/  normal LVF  . CYSTOSCOPY/URETEROSCOPY/HOLMIUM LASER/STENT PLACEMENT Left 04/28/2015   Procedure: CYSTOSCOPY/LEFT RETROGRADE LEFT URETEROSCOPY/STENT PLACEMENT/BLADDER BIOPSY WITH FULGERATION;  Surgeon: Jerilee Field, MD;  Location: [facility];  Service: Urology;  Laterality: Left;  . LAPAROSCOPIC CHOLECYSTECTOMY  04-01-2003  .  TONSILLECTOMY      History  Smoking Status  . Former Smoker  . Quit date: 03/29/1986  Smokeless Tobacco  . Never Used    History  Alcohol Use No    Family History  Problem Relation Age of Onset  . Liver cancer Mother   . Emphysema Father   . Hypertension Brother   . Coronary artery disease Brother     Review of Systems: As noted in history of present illness. All other systems were reviewed and are negative.  Physical Exam: BP (!) 155/85   Pulse (!) 52   Ht  (1.727 m)   Wt 237 lb 9.6 oz (107.8 kg)   BMI 36.13 kg/m  GENERAL:  Well appearing Tim Heath in NAD HEENT:  PERRL, EOMI, sclera are clear. Oropharynx is clear. NECK:  No jugular venous distention, carotid upstroke brisk and symmetric, no bruits, no thyromegaly or adenopathy LUNGS:  Clear to auscultation bilaterally CHEST:  Unremarkable HEART:  RRR,  PMI not displaced or sustained,S1 and S2 within normal limits, no S3, no S4: no clicks, no rubs, no murmurs ABD:  Soft, nontender. BS +, no masses or bruits. No hepatomegaly, no splenomegaly EXT:  2 + pulses throughout, no edema, no cyanosis no clubbing SKIN:  Warm and dry.  No rashes NEURO:  Alert and oriented x 3. Cranial nerves II through XII intact. PSYCH:  Cognitively intact    LABORATORY DATA:   Lab Results  Component Value Date   WBC 6.6 04/25/2016   HGB 14.4 04/25/2016   HCT 44.4 04/25/2016   PLT 182 04/25/2016   GLUCOSE 119 (H) 04/24/2016   CHOL 110 04/24/2016   TRIG 114 04/24/2016   HDL 28 (L) 04/24/2016   LDLCALC 59 04/24/2016   ALT 51 (H) 11/23/2015   AST 31 11/23/2015   NA 139 04/24/2016   K 3.7 04/24/2016   CL 106 04/24/2016   CREATININE 0.64 04/24/2016   BUN 17 04/24/2016   CO2 26 04/24/2016   TSH 1.066 04/23/2016   INR 0.95 04/23/2016   HGBA1C 6.2 (H) 04/23/2016   Labs reviewed from primary care: A1c 6.5%. Cholesterol 137, triglycerides 52, HDL 34, LDL 92. Glucose 124.  Dated 07/10/17: cholesterol 113, triglycerides 68, HDL 31, LDL  68. A1c 6.1%. CBC and CMET normal.   Ecg today shows sinus brady with HR 52. Otherwise normal. I have personally reviewed and interpreted this study.  Assessment / Plan: 1. Coronary disease status post stenting procedures as noted above.  Cardiac catheterization in July 2017 showed nonobstructive disease.  He is currently asymptomatic. Continue risk factor modification.  2. Morbid obesity. Fantastic weight loss. Continue current lifestyle changes.   3. Hyperlipidemia. Lipids improved significantly with weight loss.   4. Hypertension-BP is  Elevated today but has been controlled. Will monitor at home.  5. DM type 2. On metformin now. A1c much better with weight loss.   I will follow up in 6 months.

## 2017-07-26 NOTE — Telephone Encounter (Signed)
New Message ° °Pt states he is returning RN call .please call back to discuss  °

## 2017-07-31 ENCOUNTER — Ambulatory Visit (INDEPENDENT_AMBULATORY_CARE_PROVIDER_SITE_OTHER): Payer: Managed Care, Other (non HMO) | Admitting: Cardiology

## 2017-07-31 ENCOUNTER — Encounter: Payer: Self-pay | Admitting: Cardiology

## 2017-07-31 VITALS — BP 155/85 | HR 52 | Ht 68.0 in | Wt 237.6 lb

## 2017-07-31 DIAGNOSIS — E785 Hyperlipidemia, unspecified: Secondary | ICD-10-CM | POA: Diagnosis not present

## 2017-07-31 DIAGNOSIS — I2510 Atherosclerotic heart disease of native coronary artery without angina pectoris: Secondary | ICD-10-CM

## 2017-07-31 DIAGNOSIS — I10 Essential (primary) hypertension: Secondary | ICD-10-CM | POA: Diagnosis not present

## 2017-07-31 DIAGNOSIS — E118 Type 2 diabetes mellitus with unspecified complications: Secondary | ICD-10-CM

## 2017-07-31 NOTE — Patient Instructions (Signed)
Continue your current therapy  Keep an eye on your blood pressure at home  I will see you in 6 months.

## 2017-08-28 ENCOUNTER — Other Ambulatory Visit: Payer: Self-pay | Admitting: Cardiology

## 2017-10-09 ENCOUNTER — Other Ambulatory Visit: Payer: Self-pay | Admitting: Cardiology

## 2017-10-09 NOTE — Telephone Encounter (Signed)
REFILL 

## 2017-10-09 NOTE — Telephone Encounter (Signed)
°*  STAT* If patient is at the pharmacy, call can be transferred to refill team.   1. Which medications need to be refilled? (please list name of each medication and dose if known) lisinopril 20 mg  2. Which pharmacy/location (including street and city if local pharmacy) is medication to be sent to? [facility] [address] - [place], [place] - [phone]  3. Do they need a 30 day or 90 day supply? 90

## 2017-11-27 ENCOUNTER — Other Ambulatory Visit: Payer: Self-pay | Admitting: Cardiology

## 2018-01-28 ENCOUNTER — Emergency Department (HOSPITAL_COMMUNITY): Payer: Managed Care, Other (non HMO)

## 2018-01-28 ENCOUNTER — Encounter (HOSPITAL_COMMUNITY): Payer: Self-pay

## 2018-01-28 ENCOUNTER — Other Ambulatory Visit: Payer: Self-pay

## 2018-01-28 ENCOUNTER — Observation Stay (HOSPITAL_COMMUNITY)
Admission: EM | Admit: 2018-01-28 | Discharge: 2018-01-29 | Disposition: A | Discharge status: Home / Self Care | Payer: Managed Care, Other (non HMO) | Attending: Internal Medicine | Admitting: Internal Medicine

## 2018-01-28 DIAGNOSIS — Z7984 Long term (current) use of oral hypoglycemic drugs: Secondary | ICD-10-CM | POA: Insufficient documentation

## 2018-01-28 DIAGNOSIS — Z7902 Long term (current) use of antithrombotics/antiplatelets: Secondary | ICD-10-CM | POA: Diagnosis not present

## 2018-01-28 DIAGNOSIS — Z87891 Personal history of nicotine dependence: Secondary | ICD-10-CM | POA: Insufficient documentation

## 2018-01-28 DIAGNOSIS — R079 Chest pain, unspecified: Secondary | ICD-10-CM | POA: Diagnosis not present

## 2018-01-28 DIAGNOSIS — R001 Bradycardia, unspecified: Secondary | ICD-10-CM | POA: Insufficient documentation

## 2018-01-28 DIAGNOSIS — I10 Essential (primary) hypertension: Secondary | ICD-10-CM | POA: Diagnosis not present

## 2018-01-28 DIAGNOSIS — Z955 Presence of coronary angioplasty implant and graft: Secondary | ICD-10-CM | POA: Diagnosis not present

## 2018-01-28 DIAGNOSIS — M898X2 Other specified disorders of bone, upper arm: Secondary | ICD-10-CM | POA: Diagnosis not present

## 2018-01-28 DIAGNOSIS — E785 Hyperlipidemia, unspecified: Secondary | ICD-10-CM | POA: Diagnosis not present

## 2018-01-28 DIAGNOSIS — K219 Gastro-esophageal reflux disease without esophagitis: Secondary | ICD-10-CM | POA: Insufficient documentation

## 2018-01-28 DIAGNOSIS — Z79899 Other long term (current) drug therapy: Secondary | ICD-10-CM | POA: Insufficient documentation

## 2018-01-28 DIAGNOSIS — I2510 Atherosclerotic heart disease of native coronary artery without angina pectoris: Secondary | ICD-10-CM | POA: Diagnosis not present

## 2018-01-28 DIAGNOSIS — Z7982 Long term (current) use of aspirin: Secondary | ICD-10-CM | POA: Diagnosis not present

## 2018-01-28 DIAGNOSIS — E119 Type 2 diabetes mellitus without complications: Secondary | ICD-10-CM | POA: Diagnosis not present

## 2018-01-28 DIAGNOSIS — M899 Disorder of bone, unspecified: Secondary | ICD-10-CM | POA: Diagnosis present

## 2018-01-28 DIAGNOSIS — Z8249 Family history of ischemic heart disease and other diseases of the circulatory system: Secondary | ICD-10-CM | POA: Insufficient documentation

## 2018-01-28 LAB — RAPID URINE DRUG SCREEN, HOSP PERFORMED
Amphetamines: NOT DETECTED
BENZODIAZEPINES: NOT DETECTED
Barbiturates: NOT DETECTED
COCAINE: NOT DETECTED
OPIATES: NOT DETECTED
TETRAHYDROCANNABINOL: NOT DETECTED

## 2018-01-28 LAB — CBC
HEMATOCRIT: 46.3 % (ref 39.0–52.0)
HEMOGLOBIN: 15.3 g/dL (ref 13.0–17.0)
MCH: 31.2 pg (ref 26.0–34.0)
MCHC: 33 g/dL (ref 30.0–36.0)
MCV: 94.3 fL (ref 78.0–100.0)
Platelets: 170 10*3/uL (ref 150–400)
RBC: 4.91 MIL/uL (ref 4.22–5.81)
RDW: 13 % (ref 11.5–15.5)
WBC: 5.3 10*3/uL (ref 4.0–10.5)

## 2018-01-28 LAB — BASIC METABOLIC PANEL
ANION GAP: 10 (ref 5–15)
BUN: 16 mg/dL (ref 6–20)
CHLORIDE: 104 mmol/L (ref 101–111)
CO2: 24 mmol/L (ref 22–32)
Calcium: 9.2 mg/dL (ref 8.9–10.3)
Creatinine, Ser: 0.68 mg/dL (ref 0.61–1.24)
GFR calc non Af Amer: >60 mL/min (ref >60)
Glucose, Bld: 82 mg/dL (ref 65–99)
POTASSIUM: 3.9 mmol/L (ref 3.5–5.1)
SODIUM: 138 mmol/L (ref 135–145)

## 2018-01-28 LAB — I-STAT TROPONIN, ED: Troponin i, poc: 0 ng/mL (ref 0.00–0.08)

## 2018-01-28 LAB — TROPONIN I: Troponin I: <0.03 ng/mL (ref <0.03)

## 2018-01-28 LAB — CBG MONITORING, ED: Glucose-Capillary: 106 mg/dL — ABNORMAL HIGH (ref 65–99)

## 2018-01-28 IMAGING — CR DG CHEST 2V
2 series · 2 of 2 positions shown · non-contrast
Comparison: [DATE], [DATE] and earlier.

CLINICAL DATA: Acute onset of chest pain that began yesterday.
Current history of coronary artery disease with prior RIGHT coronary
stenting in [[place]].

EXAM:
CHEST - 2 VIEW

[chest pa]
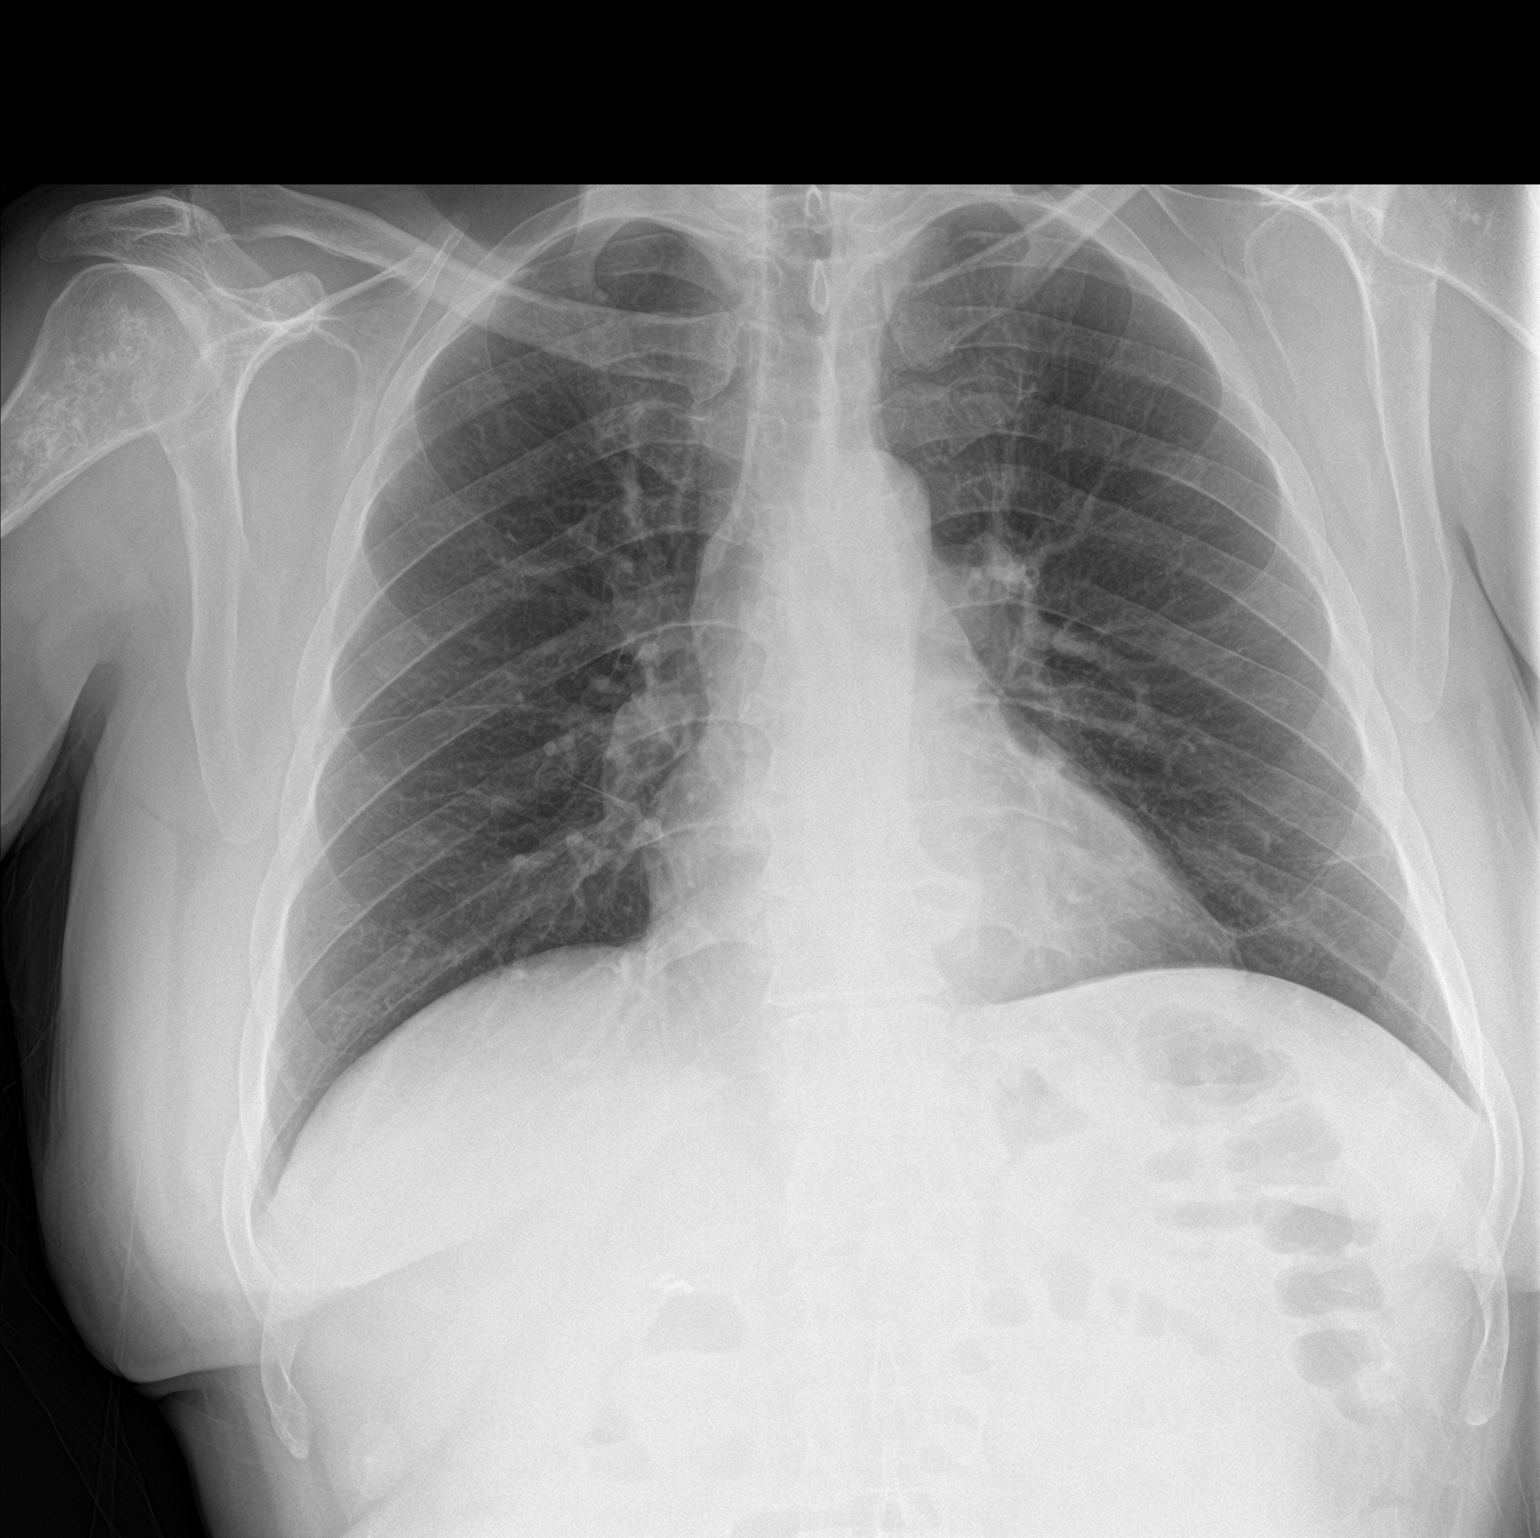

[chest lat]
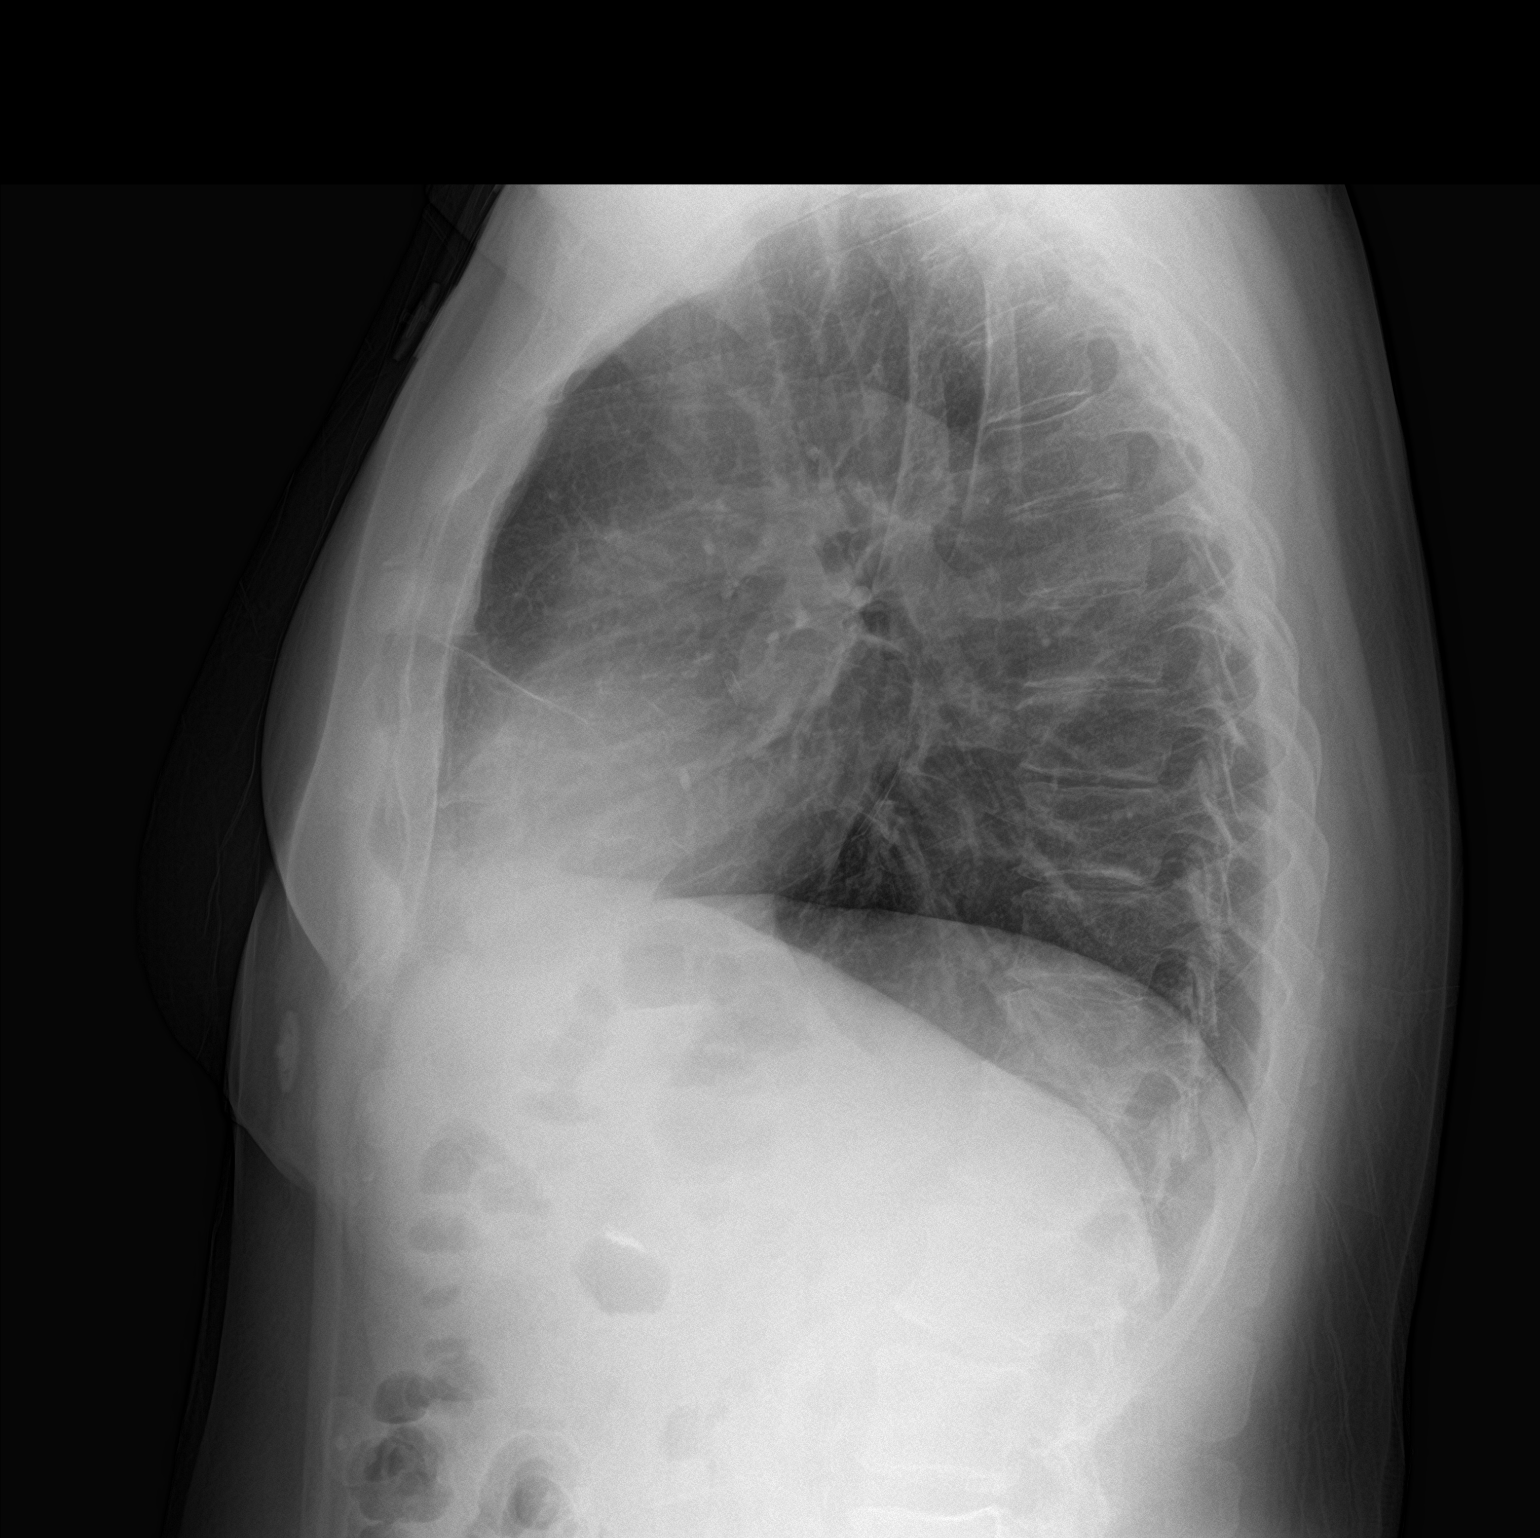

[2 of 2 positions shown; findings below may reference images not displayed]

FINDINGS: Cardiomediastinal silhouette unremarkable, unchanged. Minimal linear
scarring in the lingula, unchanged. Lungs otherwise clear.
Bronchovascular markings normal. Pulmonary vascularity normal. No
visible pleural effusions. No pneumothorax. Mild degenerative
changes involving the thoracic and UPPER lumbar spine. Bone infarct
versus low-grade cartilaginous lesion involving the RIGHT humeral
head and neck, with a likely similar smaller lesion in the LEFT
humeral neck.
IMPRESSION: 1.  No acute cardiopulmonary disease.
2. Bone infarct versus low-grade cartilaginous lesion involving the
RIGHT humeral head neck and a similar smaller lesion in the LEFT
humeral neck.

## 2018-01-28 MED ORDER — MORPHINE SULFATE (PF) 4 MG/ML IV SOLN
2.0000 mg | INTRAVENOUS | Status: DC | PRN
Start: 2018-01-28 — End: 2018-01-29
  Filled 2018-01-28: qty 1

## 2018-01-28 MED ORDER — ATORVASTATIN CALCIUM 80 MG PO TABS
80.0000 mg | ORAL_TABLET | Freq: Every day | ORAL | Status: DC
  Administered 2018-01-28: 80 mg via ORAL
  Filled 2018-01-28: qty 1

## 2018-01-28 MED ORDER — ADULT MULTIVITAMIN W/MINERALS CH
1.0000 | ORAL_TABLET | Freq: Every day | ORAL | Status: DC
  Administered 2018-01-28: 1 via ORAL
  Filled 2018-01-28: qty 1

## 2018-01-28 MED ORDER — ALPRAZOLAM 0.25 MG PO TABS: 0.2500 mg | ORAL_TABLET | Freq: Two times a day (BID) | ORAL | Status: DC | PRN

## 2018-01-28 MED ORDER — ENOXAPARIN SODIUM 40 MG/0.4ML ~~LOC~~ SOLN
40.0000 mg | SUBCUTANEOUS | Status: DC
  Filled 2018-01-28: qty 0.4

## 2018-01-28 MED ORDER — HYDRALAZINE HCL 20 MG/ML IJ SOLN: 5.0000 mg | INTRAMUSCULAR | Status: DC | PRN

## 2018-01-28 MED ORDER — GLUCOSAMINE-CHONDROITIN 250-200 MG PO TABS: 1.0000 | ORAL_TABLET | Freq: Every day | ORAL | Status: DC

## 2018-01-28 MED ORDER — METOPROLOL SUCCINATE ER 50 MG PO TB24: 50.0000 mg | ORAL_TABLET | Freq: Every day | ORAL | Status: DC

## 2018-01-28 MED ORDER — NITROGLYCERIN 0.4 MG SL SUBL: 0.4000 mg | SUBLINGUAL_TABLET | SUBLINGUAL | Status: DC | PRN

## 2018-01-28 MED ORDER — ASPIRIN 81 MG PO TABS: 81.0000 mg | ORAL_TABLET | Freq: Every day | ORAL | Status: DC

## 2018-01-28 MED ORDER — ASPIRIN 81 MG PO CHEW: 324.0000 mg | CHEWABLE_TABLET | Freq: Once | ORAL | Status: DC

## 2018-01-28 MED ORDER — PANTOPRAZOLE SODIUM 40 MG PO TBEC
40.0000 mg | DELAYED_RELEASE_TABLET | Freq: Every day | ORAL | Status: DC
  Administered 2018-01-29: 40 mg via ORAL
  Filled 2018-01-28: qty 1

## 2018-01-28 MED ORDER — ASPIRIN EC 81 MG PO TBEC
81.0000 mg | DELAYED_RELEASE_TABLET | Freq: Every day | ORAL | Status: DC
  Administered 2018-01-28: 81 mg via ORAL
  Filled 2018-01-28: qty 1

## 2018-01-28 MED ORDER — ASPIRIN EC 325 MG PO TBEC: 325.0000 mg | DELAYED_RELEASE_TABLET | Freq: Every day | ORAL | Status: DC

## 2018-01-28 MED ORDER — INSULIN ASPART 100 UNIT/ML ~~LOC~~ SOLN
0.0000 [IU] | Freq: Every day | SUBCUTANEOUS | Status: DC
Start: 2018-01-28 — End: 2018-01-29

## 2018-01-28 MED ORDER — LISINOPRIL 20 MG PO TABS
20.0000 mg | ORAL_TABLET | Freq: Every day | ORAL | Status: DC
  Administered 2018-01-29: 20 mg via ORAL
  Filled 2018-01-28: qty 1

## 2018-01-28 MED ORDER — CLOPIDOGREL BISULFATE 75 MG PO TABS
75.0000 mg | ORAL_TABLET | Freq: Every day | ORAL | Status: DC
  Administered 2018-01-29: 75 mg via ORAL
  Filled 2018-01-28: qty 1

## 2018-01-28 MED ORDER — ZOLPIDEM TARTRATE 5 MG PO TABS: 5.0000 mg | ORAL_TABLET | Freq: Every evening | ORAL | Status: DC | PRN

## 2018-01-28 MED ORDER — INSULIN ASPART 100 UNIT/ML ~~LOC~~ SOLN: 0.0000 [IU] | Freq: Three times a day (TID) | SUBCUTANEOUS | Status: DC

## 2018-01-28 MED ORDER — ACETAMINOPHEN 325 MG PO TABS: 650.0000 mg | ORAL_TABLET | ORAL | Status: DC | PRN

## 2018-01-28 MED ORDER — ONDANSETRON HCL 4 MG/2ML IJ SOLN: 4.0000 mg | Freq: Four times a day (QID) | INTRAMUSCULAR | Status: DC | PRN

## 2018-01-28 NOTE — ED Provider Notes (Signed)
[facility] Provider Note   CSN: [id number] Arrival date & time: 01/28/18  1521     History   Chief Complaint Chief Complaint  Patient presents with  . Chest Pain    HPI Tim Heath is a 57 y.o. male.  The history is provided by the patient. No language interpreter was used.  Chest Pain      Tim Heath is a 57 y.o. male who presents to the Emergency Department complaining of chest pain. He presents to the emergency department for evaluation of chest pain. Pain began at yesterday and is located in his left upper chest. He described as a dull and pressure type sensation. It is waxing and waning with no clear alleviating or exacerbating factors. At times it radiates down his left arm. He denies any fevers, shortness of breath, abdominal pain, vomiting. He does have some associated nausea. He has a history of coronary artery disease and pain feels similar to when he did a stent in the past. He takes a baby aspirin daily. He did have pain when he arrived to the emergency department but it is currently resolved.  Past Medical History:  Diagnosis Date  . Coronary artery disease    a. DES-RCA 2003 b. DES-RI 2004/ normal LVF c. cath 04/2016: nonobstructive CAD w/ patent stents in RI and RCA.   Marland Kitchen. GERD (gastroesophageal reflux disease)   . History of Bell's palsy   . History of hiatal hernia   . Hyperlipidemia   . Hypertension   . Left ureteral stone     Patient Active Problem List   Diagnosis Date Noted  . Diabetes mellitus without complication (HCC) 01/28/2018  . Chest pain 01/28/2018  . Type 2 diabetes mellitus with complication, without long-term current use of insulin (HCC) 01/09/2017  . Hyperlipidemia LDL goal <70 04/23/2016  . Abnormal nuclear cardiac imaging test 04/23/2016  . Angina at rest [facility](HCC) 04/23/2016  . Angina pectoris, crescendo (HCC) 01/17/2013  . Hypertension   . Coronary artery disease   . Morbid obesity,  unspecified obesity type [facility](HCC)     Past Surgical History:  Procedure Laterality Date  . CARDIAC CATHETERIZATION  04-12-2004  &  01-18-2013  dr Swazilandjordan   Non-obstructive CAD/  continued patency of stents fo RCA and Intermediate branch/  nomral LVF, ef 55-65%  . CARDIAC CATHETERIZATION N/A 04/25/2016   Procedure: Left Heart Cath and Coronary Angiography;  Surgeon: Peter M SwazilandJordan, MD;  [facility];  Service: Cardiovascular;  Laterality: N/A;  . CARDIOVASCULAR STRESS TEST  last one 06-06-2011   dr Swazilandjordan   normal perfusion study/  no ischemia or scar/  normal LV function and wall motion, ef 66%  . CORONARY ANGIOPLASTY WITH STENT PLACEMENT  07-30-2002  dr Swazilandjordan   abnormal cardiolite (inferior wall ischemia)  single-vessel CAD ,  balloon dilation and DES to RCA/  mild disease LAD 30-40%, non-obstructive/  perserved LV, ef 65%  . CORONARY ANGIOPLASTY WITH STENT PLACEMENT  05-16-2003  dr Swazilandjordan   DES x1 to Intermediate branch (90%)/  pLAD 40-50%,  mD1  50%,  patent RCA stent/  normal LVF  . CYSTOSCOPY/URETEROSCOPY/HOLMIUM LASER/STENT PLACEMENT Left 04/28/2015   Procedure: CYSTOSCOPY/LEFT RETROGRADE LEFT URETEROSCOPY/STENT PLACEMENT/BLADDER BIOPSY WITH FULGERATION;  Surgeon: Jerilee FieldMatthew Eskridge, MD;  [facility];  Service: Urology;  Laterality: Left;  . LAPAROSCOPIC CHOLECYSTECTOMY  04-01-2003  . TONSILLECTOMY          Home Medications  Prior to Admission medications   Medication Sig Start Date End Date Taking? Authorizing Provider  aspirin 81 MG tablet Take 81 mg by mouth daily.      [provider]  atorvastatin (LIPITOR) 80 MG tablet TAKE ONE TABLET BY MOUTH DAILY. 08/29/17   Swazil[place], Peter M, MD  clopidogrel (PLAVIX) 75 MG tablet TAKE ONE TABLET BY MOUTH DAILY. 08/29/17   [place], Peter M, MD  Glucosamine-Chondroitin (OSTEO BI-FLEX REGULAR STRENGTH PO) Take 2 tablets by mouth daily.    [provider]  lisinopril (PRINIVIL,ZESTRIL) 20 MG  tablet TAKE 1 TABLET BY MOUTH ONCE DAILY. 10/09/17   [place], Peter M, MD  metFORMIN (GLUCOPHAGE) 500 MG tablet Take 500 mg by mouth daily.    [provider]  metoprolol succinate (TOPROL-XL) 50 MG 24 hr tablet TAKE ONE TABLET BY MOUTH ONCE DAILY 11/28/17   [place], Peter M, MD  Multiple Vitamin (MULTIVITAMIN) tablet Take 1 tablet by mouth daily.      [provider]  nitroGLYCERIN (NITROSTAT) 0.4 MG SL tablet Place 1 tablet (0.4 mg total) under the tongue every 5 (five) minutes as needed for chest pain. 04/14/16   Focht, Joyce Copa, PA  omeprazole (PRILOSEC) 20 MG capsule Take 1 capsule (20 mg total) by mouth daily. 03/27/14   [place], Peter M, MD    Family History Family History  Problem Relation Age of Onset  . Liver cancer Mother   . Emphysema Father   . Hypertension Brother   . Coronary artery disease Brother     Social History Social History   Tobacco Use  . Smoking status: Former Smoker    Last attempt to quit: 03/29/1986    Years since quitting: 31.8  . Smokeless tobacco: Never Used  Substance Use Topics  . Alcohol use: No    Alcohol/week: 0.0 oz  . Drug use: No     Allergies   Patient has no known allergies.   Review of Systems Review of Systems  Cardiovascular: Positive for chest pain.  All other systems reviewed and are negative.    Physical Exam Updated Vital Signs BP 137/78   Pulse (!) 53   Temp 98.1 F (36.7 C) (Oral)   Resp 11   SpO2 99%   Physical Exam  Constitutional: He is oriented to person, place, and time. He appears well-developed and well-nourished.  HENT:  Head: Normocephalic and atraumatic.  Cardiovascular: Normal rate and regular rhythm.  No murmur heard. Pulmonary/Chest: Effort normal and breath sounds normal. No respiratory distress. He exhibits no tenderness.  Abdominal: Soft. There is no tenderness. There is no rebound and no guarding.  Musculoskeletal: He exhibits no edema or tenderness.  2+ radial pulses bilaterally.  Able to arrange bilateral shoulders without difficulty.  Neurological: He is alert and oriented to person, place, and time.  Skin: Skin is warm and dry.  Psychiatric: He has a normal mood and affect. His behavior is normal.  Nursing note and vitals reviewed.    ED Treatments / Results  Labs (all labs ordered are listed, but only abnormal results are displayed) Labs Reviewed  BASIC METABOLIC PANEL  CBC  RAPID URINE DRUG SCREEN, HOSP PERFORMED  HEMOGLOBIN A1C  LIPID PANEL  TROPONIN I  TROPONIN I  TROPONIN I  I-STAT TROPONIN, ED    EKG EKG Interpretation  Date/Time:  Sunday January 28 2018 15:30:38 EDT Ventricular Rate:  53 PR Interval:  146 QRS Duration: 96 QT Interval:  422 QTC Calculation: 395 R Axis:  51 Text Interpretation:  Sinus bradycardia Otherwise normal ECG No significant change since last tracing Confirmed by Tilden Fossa [phone]) on 01/28/2018 6:42:55 PM   Radiology Dg Chest 2 View  Result Date: 01/28/2018 CLINICAL DATA:  Acute onset of chest pain that began yesterday. Current history of coronary artery disease with prior RIGHT coronary stenting in 2017. EXAM: CHEST - 2 VIEW COMPARISON:  04/14/2016, 01/17/2013 and earlier. FINDINGS: Cardiomediastinal silhouette unremarkable, unchanged. Minimal linear scarring in the lingula, unchanged. Lungs otherwise clear. Bronchovascular markings normal. Pulmonary vascularity normal. No visible pleural effusions. No pneumothorax. Mild degenerative changes involving the thoracic and UPPER lumbar spine. 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 IMPRESSION: 1.  No acute cardiopulmonary disease. 2. 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 Electronically Signed   By: Hulan Saas M.D.   On: 01/28/2018 15:51     Procedures Procedures (including critical care time)  Medications Ordered in ED Medications  atorvastatin (LIPITOR) tablet 80 mg (has no administration in time range)  clopidogrel (PLAVIX) tablet 75 mg (has no administration in time range)  Glucosamine-Chondroitin 250-200 MG TABS 1 capsule (has no administration in time range)  lisinopril (PRINIVIL,ZESTRIL) tablet 20 mg (has no administration in time range)  metoprolol succinate (TOPROL-XL) 24 hr tablet 50 mg (has no administration in time range)  multivitamin tablet 1 tablet (has no administration in time range)  nitroGLYCERIN (NITROSTAT) SL tablet 0.4 mg (has no administration in time range)  pantoprazole (PROTONIX) EC tablet 40 mg (has no administration in time range)  morphine 4 MG/ML injection 2 mg (has no administration in time range)  aspirin tablet 81 mg (has no administration in time range)     Initial Impression / Assessment and Plan / ED Course  I have reviewed the triage vital signs and the nursing notes.  Pertinent labs & imaging results that were available during my care of the patient were reviewed by me and considered in my medical decision making (see chart for details).     Patient here for evaluation of waxing and waning chest pain since yesterday. EKG with no acute ischemic changes. He has a heart score of four, pain free in the emergency department. Presentation is not consistent with dissection or PE. Cardiology consulted for recommendations.  Because the case with cardiology fellow who recommends medicine admission for serial troponins and stress testing.  Final Clinical Impressions(s) / ED Diagnoses   Final diagnoses:  None    ED Discharge Orders    None       Tilden Fossa, MD 01/28/18 [phone]

## 2018-01-28 NOTE — H&P (Signed)
History and Physical    Tim Heath ZOX:[id number] DOB: [birth year] DOA: 01/28/2018  Referring MD/NP/PA:   PCP: Johny BlamerHarris, William, MD   Patient coming from:  The patient is coming from home.  At baseline, pt is independent for most of ADL.   Chief Complaint: chest pain  HPI: Tim Heath is a 57 y.o. male with medical history significant of CAD, s/p of stent placement, hyperlipidemia, diabetes mellitus, GERD, Bell's palsy, left ureteral stone, who presents with chest pain.  Patient states that he has been having intermittent chest pain since yesterday. The chest is located in the left lower chest, sharp, up to 8 out of 10 in severity, currently 2/10 in severity. He stats that his chest pan is radiating to the left arm and shoulder, make her left arm tingling. It is not associated with shortness breath. Patient does not have cough, fever or chills. No recent long distant traveling, no tenderness in calf areas. Patient does not have unilateral weakness in extremities. No facial droop, slurred speech. No vision change or hearing loss. Patient has nausea, but no vomiting, diarrhea or abdominal pain. Denies symptoms of UTI. Pt states that he is on diet for intentional weight loss. He lost 115 LB in past 13 months.  ED Course: pt was found to have negative troponin, WBC 5.3, electrolytes renal function okay, bradycardia with a heart rate down to 37, currently heart rate is at the 50s. Temperature normal, no tachypnea, oxygen saturation 99% on room air. Chest x-ray has no infiltration, but showed bone lesion in bilateral humerus neck. Pt is placed on telemetry bed for observation. Card fellow, Dr. Cristina GongVedre was consulted by EDP.  Review of Systems:   General: no fevers, chills, no body weight gain, has fatigue HEENT: no blurry vision, hearing changes or sore throat Respiratory: no dyspnea, coughing, wheezing CV: has chest pain, no palpitations GI: no nausea, vomiting, abdominal pain, diarrhea,  constipation GU: no dysuria, burning on urination, increased urinary frequency, hematuria  Ext: no leg edema Neuro: no unilateral weakness, numbness, or tingling, no vision change or hearing loss Skin: no rash, no skin tear. MSK: No muscle spasm, no deformity, no limitation of range of movement in spin Heme: No easy bruising.  Travel history: No recent long distant travel.  Allergy: No Known Allergies  Past Medical History:  Diagnosis Date  . Coronary artery disease    a. DES-RCA 2003 b. DES-RI 2004/ normal LVF c. cath 04/2016: nonobstructive CAD w/ patent stents in RI and RCA.   Marland Kitchen. GERD (gastroesophageal reflux disease)   . History of Bell's palsy   . History of hiatal hernia   . Hyperlipidemia   . Hypertension   . Left ureteral stone     Past Surgical History:  Procedure Laterality Date  . CARDIAC CATHETERIZATION  04-12-2004  &  01-18-2013  dr Swazilandjordan   Non-obstructive CAD/  continued patency of stents fo RCA and Intermediate branch/  nomral LVF, ef 55-65%  . CARDIAC CATHETERIZATION N/A 04/25/2016   Procedure: Left Heart Cath and Coronary Angiography;  Surgeon: Peter M SwazilandJordan, MD;  [facility];  Service: Cardiovascular;  Laterality: N/A;  . CARDIOVASCULAR STRESS TEST  last one 06-06-2011   dr Swazilandjordan   normal perfusion study/  no ischemia or scar/  normal LV function and wall motion, ef 66%  . CORONARY ANGIOPLASTY WITH STENT PLACEMENT  07-30-2002  dr Swazilandjordan   abnormal cardiolite (inferior wall ischemia)  single-vessel CAD ,  balloon dilation  and DES to RCA/  mild disease LAD 30-40%, non-obstructive/  perserved LV, ef 65%  . CORONARY ANGIOPLASTY WITH STENT PLACEMENT  05-16-2003  dr Swaziland   DES x1 to Intermediate branch (90%)/  pLAD 40-50%,  mD1  50%,  patent RCA stent/  normal LVF  . CYSTOSCOPY/URETEROSCOPY/HOLMIUM LASER/STENT PLACEMENT Left 04/28/2015   Procedure: CYSTOSCOPY/LEFT RETROGRADE LEFT URETEROSCOPY/STENT PLACEMENT/BLADDER BIOPSY WITH FULGERATION;  Surgeon:  Jerilee Field, MD;  Location: [facility];  Service: Urology;  Laterality: Left;  . LAPAROSCOPIC CHOLECYSTECTOMY  04-01-2003  . TONSILLECTOMY      Social History:  reports that he quit smoking about 31 years ago. He has never used smokeless tobacco. He reports that he does not drink alcohol or use drugs.  Family History:  Family History  Problem Relation Age of Onset  . Liver cancer Mother   . Emphysema Father   . Hypertension Brother   . Coronary artery disease Brother      Prior to Admission medications   Medication Sig Start Date End Date Taking? Authorizing Provider  aspirin 81 MG tablet Take 81 mg by mouth daily.      [provider]  atorvastatin (LIPITOR) 80 MG tablet TAKE ONE TABLET BY MOUTH DAILY. 08/29/17   [place], Peter M, MD  clopidogrel (PLAVIX) 75 MG tablet TAKE ONE TABLET BY MOUTH DAILY. 08/29/17   [place], Peter M, MD  Glucosamine-Chondroitin (OSTEO BI-FLEX REGULAR STRENGTH PO) Take 2 tablets by mouth daily.    [provider]  lisinopril (PRINIVIL,ZESTRIL) 20 MG tablet TAKE 1 TABLET BY MOUTH ONCE DAILY. 10/09/17   [place], Peter M, MD  metFORMIN (GLUCOPHAGE) 500 MG tablet Take 500 mg by mouth daily.    [provider]  metoprolol succinate (TOPROL-XL) 50 MG 24 hr tablet TAKE ONE TABLET BY MOUTH ONCE DAILY 11/28/17   [place], Peter M, MD  Multiple Vitamin (MULTIVITAMIN) tablet Take 1 tablet by mouth daily.      [provider]  nitroGLYCERIN (NITROSTAT) 0.4 MG SL tablet Place 1 tablet (0.4 mg total) under the tongue every 5 (five) minutes as needed for chest pain. 04/14/16   Focht, Joyce Copa, PA  omeprazole (PRILOSEC) 20 MG capsule Take 1 capsule (20 mg total) by mouth daily. 03/27/14   [place], Peter M, MD    Physical Exam: Vitals:   01/28/18 1900 01/28/18 2000 01/28/18 2045 01/28/18 2100  BP: 137/78   112/64  Pulse: (!) 53 (!) 58 (!) 49 (!) 48  Resp: 11 16 17 17   Temp:      TempSrc:      SpO2: 99% 100% 99% 99%    General: Not in acute distress HEENT:       Eyes: PERRL, EOMI, no scleral icterus.       ENT: No discharge from the ears and nose, no pharynx injection, no tonsillar enlargement.        Neck: No JVD, no bruit, no mass felt. Heme: No neck lymph node enlargement. Cardiac: S1/S2, RRR, No murmurs, No gallops or rubs. Respiratory: No rales, wheezing, rhonchi or rubs. GI: Soft, nondistended, nontender, no rebound pain, no organomegaly, BS present. GU: No hematuria Ext: No pitting leg edema bilaterally. 2+DP/PT pulse bilaterally. Musculoskeletal: No joint deformities, No joint redness or warmth, no limitation of ROM in spin. Skin: No rashes.  Neuro: Alert, oriented X3, cranial nerves II-XII grossly intact, moves all extremities normally.  Psych: Patient is not psychotic, no suicidal or hemocidal ideation.  Labs on Admission: I have personally reviewed  following labs and imaging studies  CBC: Recent Labs  Lab 01/28/18 1555  WBC 5.3  HGB 15.3  HCT 46.3  MCV 94.3  PLT 170   Basic Metabolic Panel: Recent Labs  Lab 01/28/18 1555  NA 138  K 3.9  CL 104  CO2 24  GLUCOSE 82  BUN 16  CREATININE 0.68  CALCIUM 9.2   GFR: CrCl cannot be calculated (Unknown ideal weight.). Liver Function Tests: No results for input(s): AST, ALT, ALKPHOS, BILITOT, PROT, ALBUMIN in the last 168 hours. No results for input(s): LIPASE, AMYLASE in the last 168 hours. No results for input(s): AMMONIA in the last 168 hours. Coagulation Profile: No results for input(s): INR, PROTIME in the last 168 hours. Cardiac Enzymes: No results for input(s): CKTOTAL, CKMB, CKMBINDEX, TROPONINI in the last 168 hours. BNP (last 3 results) No results for input(s): PROBNP in the last 8760 hours. HbA1C: No results for input(s): HGBA1C in the last 72 hours. CBG: No results for input(s): GLUCAP in the last 168 hours. Lipid Profile: No results for input(s): CHOL, HDL, LDLCALC, TRIG, CHOLHDL, LDLDIRECT in the last 72  hours. Thyroid Function Tests: No results for input(s): TSH, T4TOTAL, FREET4, T3FREE, THYROIDAB in the last 72 hours. Anemia Panel: No results for input(s): VITAMINB12, FOLATE, FERRITIN, TIBC, IRON, RETICCTPCT in the last 72 hours. Urine analysis:    Component Value Date/Time   COLORURINE AMBER (A) 01/03/2015 0759   APPEARANCEUR TURBID (A) 01/03/2015 0759   LABSPEC 1.031 (H) 01/03/2015 0759   PHURINE 5.0 01/03/2015 0759   GLUCOSEU NEGATIVE 01/03/2015 0759   HGBUR LARGE (A) 01/03/2015 0759   BILIRUBINUR MODERATE (A) 01/03/2015 0759   KETONESUR 15 (A) 01/03/2015 0759   PROTEINUR 100 (A) 01/03/2015 0759   UROBILINOGEN 0.2 01/03/2015 0759   NITRITE NEGATIVE 01/03/2015 0759   LEUKOCYTESUR SMALL (A) 01/03/2015 0759   Sepsis Labs: @LABRCNTIP (procalcitonin:4,lacticidven:4) )No results found for this or any previous visit (from the past 240 hour(s)).   Radiological Exams on Admission: Dg Chest 2 View  Result Date: 01/28/2018 CLINICAL DATA:  Acute onset of chest pain that began yesterday. Current history of coronary artery disease with prior RIGHT coronary stenting in 2017. EXAM: CHEST - 2 VIEW COMPARISON:  04/14/2016, 01/17/2013 and earlier. FINDINGS: Cardiomediastinal silhouette unremarkable, unchanged. Minimal linear scarring in the lingula, unchanged. Lungs otherwise clear. Bronchovascular markings normal. Pulmonary vascularity normal. No visible pleural effusions. No pneumothorax. Mild degenerative changes involving the thoracic and UPPER lumbar spine. 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 IMPRESSION: 1.  No acute cardiopulmonary disease. 2. 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 Electronically Signed   By: Hulan Saas M.D.   On: 01/28/2018 15:51     EKG:  Independently reviewed. Sinus rhythm, QTC 395, no ischemic change.  Assessment/Plan Principal Problem:   Chest pain Active Problems:   Hypertension   Coronary artery disease   Hyperlipidemia LDL goal <70   Diabetes mellitus without complication (HCC)   Bone lesion of humerus   Chest pain and hx of CAD: s/p of stent placement twice. Has intermittent chest pain since yesterday, at risk of developing ACS. EDP consulted card on call, Dr. Cristina Gong who spoke with pt's  Cardiologist, Dr. Roger Shelter. Dr. Sw[place]iland recommended to get stress test tomorrow. initial trop negative.  - will place on Tele bed for obs -  cycle CE q6 x3 and repeat EKG in the am  - prn Nitroglycerin, Morphine, and aspirin, lipitor and plavix - Risk factor stratification: will check FLP, UDS and A1C  - 2d echo - please call Card in AM - Inpatient non-urgent card consult order was put in Epic and message to Daun Peacock was sent out.  Hypertension: -hold metoprolol due to bradycardia -continue lisinopril -IVF hydralazine when necessary  Hyperlipidemia LDL goal <70: -lipitor  Diabetes mellitus without complication (HCC): Last A1c 6.2 on 04/23/16, controled. Patient is taking metformin at home -SSI -Check A1c  Bone lesion of humerus:  CXR showed bone infarct versus low-grade cartilaginous lesion involving the R humeral head neck and a similar smaller lesion in the LEFT humeral neck. Etiology is not clear. -May need to give referral to oncologist.   DVT ppx: SQ Lovenox Code Status: Full code Family Communication: None at bed side.    Disposition Plan:  Anticipate discharge back to previous home environment Consults called:  Card, Dr. Cristina Gong Admission status: Obs / tele   Date of Service 01/28/2018    Lorretta Harp [facility] Pager [phone]  If 7PM-7AM, please contact night-coverage [email or web address] Password [email or web address] 01/28/2018, 9:40 PM

## 2018-01-28 NOTE — ED Triage Notes (Signed)
[facility] with chest pain that started yesterday. Pt has hx of stent placement. Pt describes pain as a dull pain.

## 2018-01-29 ENCOUNTER — Observation Stay (HOSPITAL_COMMUNITY): Payer: Managed Care, Other (non HMO)

## 2018-01-29 ENCOUNTER — Other Ambulatory Visit: Payer: Self-pay

## 2018-01-29 ENCOUNTER — Observation Stay (HOSPITAL_BASED_OUTPATIENT_CLINIC_OR_DEPARTMENT_OTHER): Payer: Managed Care, Other (non HMO)

## 2018-01-29 DIAGNOSIS — E785 Hyperlipidemia, unspecified: Secondary | ICD-10-CM

## 2018-01-29 DIAGNOSIS — E119 Type 2 diabetes mellitus without complications: Secondary | ICD-10-CM | POA: Diagnosis not present

## 2018-01-29 DIAGNOSIS — R079 Chest pain, unspecified: Secondary | ICD-10-CM

## 2018-01-29 DIAGNOSIS — M899 Disorder of bone, unspecified: Secondary | ICD-10-CM

## 2018-01-29 DIAGNOSIS — R072 Precordial pain: Secondary | ICD-10-CM

## 2018-01-29 LAB — NM MYOCAR MULTI W/SPECT W/WALL MOTION / EF
CHL CUP MPHR: 163 {beats}/min
CSEPEDS: 19 s
Estimated workload: 1 METS
Exercise duration (min): 5 min
LV sys vol: 48 mL
LVDIAVOL: 109 mL (ref 62–150)
Peak HR: 118 {beats}/min
Percent HR: 72 %
Rest HR: 55 {beats}/min
TID: 1.13

## 2018-01-29 LAB — LIPID PANEL
Cholesterol: 99 mg/dL (ref 0–200)
HDL: 33 mg/dL — AB (ref >40)
LDL Cholesterol: 57 mg/dL (ref 0–99)
TRIGLYCERIDES: 46 mg/dL (ref <150)
Total CHOL/HDL Ratio: 3 RATIO
VLDL: 9 mg/dL (ref 0–40)

## 2018-01-29 LAB — TROPONIN I
Troponin I: <0.03 ng/mL (ref <0.03)
Troponin I: <0.03 ng/mL (ref <0.03)

## 2018-01-29 LAB — ECHOCARDIOGRAM COMPLETE
HEIGHTINCHES: 68 in
WEIGHTICAEL: 3073.6 [oz_av]

## 2018-01-29 LAB — GLUCOSE, CAPILLARY: Glucose-Capillary: 128 mg/dL — ABNORMAL HIGH (ref 65–99)

## 2018-01-29 LAB — VITAMIN B12: Vitamin B-12: 286 pg/mL (ref 180–914)

## 2018-01-29 LAB — HEMOGLOBIN A1C
HEMOGLOBIN A1C: 5.5 % (ref 4.8–5.6)
MEAN PLASMA GLUCOSE: 111.15 mg/dL

## 2018-01-29 LAB — CBG MONITORING, ED: Glucose-Capillary: 77 mg/dL (ref 65–99)

## 2018-01-29 LAB — HIV ANTIBODY (ROUTINE TESTING W REFLEX): HIV Screen 4th Generation wRfx: NONREACTIVE

## 2018-01-29 MED ORDER — REGADENOSON 0.4 MG/5ML IV SOLN
INTRAVENOUS | Status: AC
  Filled 2018-01-29: qty 5

## 2018-01-29 MED ORDER — TECHNETIUM TC 99M TETROFOSMIN IV KIT
10.0000 | PACK | Freq: Once | INTRAVENOUS | Status: AC | PRN
  Administered 2018-01-29: 10 via INTRAVENOUS

## 2018-01-29 MED ORDER — REGADENOSON 0.4 MG/5ML IV SOLN
0.4000 mg | Freq: Once | INTRAVENOUS | Status: AC
  Administered 2018-01-29: 0.4 mg via INTRAVENOUS
  Filled 2018-01-29: qty 5

## 2018-01-29 MED ORDER — TECHNETIUM TC 99M TETROFOSMIN IV KIT
30.0000 | PACK | Freq: Once | INTRAVENOUS | Status: AC | PRN
  Administered 2018-01-29: 30 via INTRAVENOUS

## 2018-01-29 NOTE — ED Notes (Signed)
Checked patient cbg it was [id number] notified RN of blood sugar

## 2018-01-29 NOTE — ED Notes (Signed)
Walked patient to the bathroom patient did well patient is back in bed on the monitor call bell in reach

## 2018-01-29 NOTE — ED Notes (Signed)
Pt still in NM at this time. Report called to 3E. Will transport patient upon return. Family updated to room assignment.

## 2018-01-29 NOTE — ED Notes (Signed)
Patient having Test Done at this time.

## 2018-01-29 NOTE — Progress Notes (Signed)
*  PRELIMINARY RESULTS* Echocardiogram 2D Echocardiogram has been performed.  Jeryl Columbialliott, Nykeria Mealing 01/29/2018, 3:29 PM

## 2018-01-29 NOTE — Progress Notes (Signed)
   Tim Heath presented for a nuclear stress test today.  No immediate complications.  Stress imaging is pending at this time.  Preliminary EKG findings may be listed in the chart, but the stress test result will not be finalized until perfusion imaging is complete.  1 day study, [facility] to read.  Tim Demarkhonda Parthiv Mucci, PA-C 01/29/2018, 1:19 PM

## 2018-01-29 NOTE — ED Notes (Signed)
Per NM, pt finishing up with one more aspect of scan and will be back shortly.

## 2018-01-29 NOTE — ED Notes (Signed)
Pt still in NM. Family updated to delay

## 2018-01-29 NOTE — ED Notes (Signed)
Attempted report x 1. Phone number given to Marylene LandAngela, [place]

## 2018-01-29 NOTE — Progress Notes (Signed)
Dc instructions reviewed with pt and wife, all questions entertained and  Answered, dc in wheelchair in stable condition

## 2018-01-29 NOTE — ED Notes (Signed)
Pt still in NM at this time. This RN introduced self to patient's family.

## 2018-01-29 NOTE — ED Notes (Signed)
Breakfast tray not ordered. Pt is NPO.

## 2018-01-29 NOTE — Discharge Summary (Signed)
Physician Discharge Summary  Tim Lavimothy P Skilton ZOX:[id number] DOB: Nov 22, 1960 DOA: 01/28/2018  PCP: Johny BlamerHarris, William, MD  Admit date: 01/28/2018 Discharge date: 01/29/2018   Recommendations for Outpatient Follow-Up:   B12 is on the lower end of normal-- can take over the counter sublingual replacement Follow up with PCP for the abnormal x ray of your shoulders as well as cervical (neck) spine Have held your metoprolol due to low HR Patient appears anxious-- ? Referral to CBT   Discharge Diagnosis:   Principal Problem:   Chest pain Active Problems:   Hypertension   Coronary artery disease   Hyperlipidemia LDL goal <70   Diabetes mellitus without complication (HCC)   Bone lesion of humerus   Discharge disposition:  Home  Discharge Condition: Improved.  Diet recommendation: Low sodium, heart healthy.  Carbohydrate-modified  Wound care: None.   History of Present Illness:   Tim Heath is a 57 y.o. male with medical history significant of CAD, s/p of stent placement, hyperlipidemia, diabetes mellitus, GERD, Bell's palsy, left ureteral stone, who presents with chest pain.  Patient states that he has been having intermittent chest pain since yesterday. The chest is located in the left lower chest, sharp, up to 8 out of 10 in severity, currently 2/10 in severity. He stats that his chest pan is radiating to the left arm and shoulder, make her left arm tingling. It is not associated with shortness breath. Patient does not have cough, fever or chills. No recent long distant traveling, no tenderness in calf areas. Patient does not have unilateral weakness in extremities. No facial droop, slurred speech. No vision change or hearing loss. Patient has nausea, but no vomiting, diarrhea or abdominal pain. Denies symptoms of UTI. Pt states that he is on diet for intentional weight loss. He lost 115 LB in past 13 months.     Hospital Course by Problem:   Chest pain -low risk stress  test -discussed with cardiology-- no need for further intervention -- Left ventricle: The cavity size was normal. Systolic function was   normal. The estimated ejection fraction was in the range of 60%   to 65%. Wall motion was normal; there were no regional wall   motion abnormalities. Left ventricular diastolic function   parameters were normal.   Bradycardia -hold metoprolol until follow up with PCP/cards   Bone lesion of humerus:  CXR showed bone infarct versus low-grade cartilaginous lesion involving the R humeral head neck and a similar smaller lesion in the LEFT humeral neck. Etiology is not clear. -outpatient work up  Diabetes mellitus without complication (HCC): Last A1c 6.2 on 04/23/16, controled. Patient is taking metformin at home  Hypertension: -hold metoprolol due to bradycardia -continue lisinopril  Hyperlipidemia LDL goal <70: -lipitor          Medical Consultants:   Cards (phone)  Discharge Exam:   Vitals:   01/29/18 1308 01/29/18 1428  BP: 122/65 132/84  Pulse:  74  Resp:  18  Temp:  97.7 F (36.5 C)  SpO2:  100%   Vitals:   01/29/18 1304 01/29/18 1306 01/29/18 1308 01/29/18 1428  BP: (!) 144/80 130/78 122/65 132/84  Pulse:    74  Resp:    18  Temp:    97.7 F (36.5 C)  TempSrc:    Oral  SpO2:    100%  Weight:    87.1 kg (192 lb 1.6 oz)  Height:    5\' 8"  (1.727 m)    Gen:  NAD   The results of significant diagnostics from this hospitalization (including imaging, microbiology, ancillary and laboratory) are listed below for reference.     Procedures and Diagnostic Studies:   Dg Chest 2 View  Result Date: 01/28/2018 CLINICAL DATA:  Acute onset of chest pain that began yesterday. Current history of coronary artery disease with prior RIGHT coronary stenting in 2017. EXAM: CHEST - 2 VIEW COMPARISON:  04/14/2016, 01/17/2013 and earlier. FINDINGS: Cardiomediastinal silhouette unremarkable, unchanged. Minimal linear scarring in the  lingula, unchanged. Lungs otherwise clear. Bronchovascular markings normal. Pulmonary vascularity normal. No visible pleural effusions. No pneumothorax. Mild degenerative changes involving the thoracic and UPPER lumbar spine. 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 IMPRESSION: 1.  No acute cardiopulmonary disease. 2. 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 Electronically Signed   By: Hulan Saas M.D.   On: 01/28/2018 15:51   Nm Myocar Multi W/spect W/wall Motion / Ef  Result Date: 01/29/2018  T wave inversion was noted during stress in the III, aVF, V3, V4, V5 and V6 leads, beginning at 1 minutes of stress, ending at 3 minutes of stress, and returning to baseline after 1-5 mins of recovery.  Defect 1: There is a small defect of mild severity present in the apical anterior and apex location.  Findings consistent with very mild ischemia in the LAD territory.  This is a low risk study.  The left ventricular ejection fraction is normal (55-65%).      Labs:   Basic Metabolic Panel: Recent Labs  Lab 01/28/18 1555  NA 138  K 3.9  CL 104  CO2 24  GLUCOSE 82  BUN 16  CREATININE 0.68  CALCIUM 9.2   GFR Estimated Creatinine Clearance: 109.4 mL/min (by C-G formula based on SCr of 0.68 mg/dL). Liver Function Tests: No results for input(s): AST, ALT, ALKPHOS, BILITOT, PROT, ALBUMIN in the last 168 hours. No results for input(s): LIPASE, AMYLASE in the last 168 hours. No results for input(s): AMMONIA in the last 168 hours. Coagulation profile No results for input(s): INR, PROTIME in the last 168 hours.  CBC: Recent Labs  Lab 01/28/18 1555  WBC 5.3  HGB 15.3  HCT 46.3  MCV 94.3  PLT 170   Cardiac Enzymes: Recent Labs  Lab 01/28/18 2118 01/29/18 0211  01/29/18 0948  TROPONINI <0.03 <0.03 <0.03   BNP: Invalid input(s): POCBNP CBG: Recent Labs  Lab 01/28/18 2302 01/29/18 0809 01/29/18 1449  GLUCAP 106* 77 128*   D-Dimer No results for input(s): DDIMER in the last 72 hours. Hgb A1c Recent Labs    01/29/18 0421  HGBA1C 5.5   Lipid Profile Recent Labs    01/29/18 0422  CHOL 99  HDL 33*  LDLCALC 57  TRIG 46  CHOLHDL 3.0   Thyroid function studies No results for input(s): TSH, T4TOTAL, T3FREE, THYROIDAB in the last 72 hours.  Invalid input(s): FREET3 Anemia work up Recent Labs    01/29/18 0948  [id number]   Microbiology No results found for this or any previous visit (from the past 240 hour(s)).   Discharge Instructions:   Discharge Instructions    Diet - low sodium heart healthy   Complete by:  As directed    Diet Carb Modified   Complete by:  As directed    Discharge instructions  Complete by:  As directed    Stress test was low risk which is great B12 is on the lower end of normal-- can take over the counter sublingual replacement Follow up with PCP for the abnormal x ray of your shoulders as well as cervical (neck) spine Have held your metoprolol due to low HR   Increase activity slowly   Complete by:  As directed      Allergies as of 01/29/2018   No Known Allergies     Medication List    STOP taking these medications   metoprolol succinate 50 MG 24 hr tablet Commonly known as:  TOPROL-XL     TAKE these medications   aspirin 81 MG tablet Take 81 mg by mouth daily.   atorvastatin 80 MG tablet Commonly known as:  LIPITOR TAKE ONE TABLET BY MOUTH DAILY.   clopidogrel 75 MG tablet Commonly known as:  PLAVIX TAKE ONE TABLET BY MOUTH DAILY.   lisinopril 20 MG tablet Commonly known as:  PRINIVIL,ZESTRIL TAKE 1 TABLET BY MOUTH ONCE DAILY.   multivitamin tablet Take 1 tablet by mouth daily.   nitroGLYCERIN 0.4 MG SL tablet Commonly known as:  NITROSTAT Place 1 tablet (0.4 mg  total) under the tongue every 5 (five) minutes as needed for chest pain.   omeprazole 20 MG capsule Commonly known as:  PRILOSEC Take 1 capsule (20 mg total) by mouth daily.      Follow-up Information    [place], Peter M, MD Follow up.   Specialty:  Cardiology Why:  our office will call you with a hospital follow-up appointment  Contact information: [address] [place] [address] [phone]        Johny Blamer, MD Follow up in 1 week(s).   Specialty:  Family Medicine Contact information: [address] [place] [address] [phone]            Time coordinating discharge: 35 min  Signed:  Joseph Art   [facility] 01/29/2018, 5:14 PM

## 2018-01-30 ENCOUNTER — Telehealth: Payer: Self-pay | Admitting: Cardiology

## 2018-01-30 NOTE — Telephone Encounter (Signed)
Follow Up:; ° ° °Returning your call. °

## 2018-01-30 NOTE — Telephone Encounter (Signed)
Returned call to patient Dr.Jordan advised continue to hold metoprolol.Advised keep appointment as planned with Dr.Jordan 02/19/18 at 8:00 am.

## 2018-01-30 NOTE — Telephone Encounter (Signed)
New Message  Pt c/o medication issue:  1. Name of Medication: Metoprolol  2. How are you currently taking this medication (dosage and times per day)? n/a  3. Are you having a reaction (difficulty breathing--STAT)? no  4. What is your medication issue? Pt states that the er doctor took him off the Metoprolol and he wants to know if he needs to discontinue it until his appt on 4/29 or start back taking it. Please call

## 2018-01-30 NOTE — Telephone Encounter (Signed)
He should continue to hold it due to bradycardia  Peter SwazilandJordan MD, [facility] Hospital[facility]ACC

## 2018-01-30 NOTE — Telephone Encounter (Signed)
Returned call to patient no answer.Unable to leave message, no voice mail.

## 2018-01-30 NOTE — Telephone Encounter (Signed)
Attempt to return call, no answer and unable to leave VM 

## 2018-01-30 NOTE — Telephone Encounter (Signed)
Pt recently had ER visit where his Metoprolol was dc'd for bradycardia. Pt called to ask if he should begin it again. He stated he is taking it for BP control. He continues to take Lisonopril 20mg  qd. This morning his SBP was 118.   Pt would like to confirm with Dr SwazilandJordan to continue to hold Metoprolol until his next f/up on 4/29. I told him to follow the ER Dr's recommendation until he hears from Dr SwazilandJordan.

## 2018-02-08 ENCOUNTER — Other Ambulatory Visit: Payer: Self-pay | Admitting: Family Medicine

## 2018-02-08 DIAGNOSIS — M899 Disorder of bone, unspecified: Secondary | ICD-10-CM

## 2018-02-18 NOTE — Progress Notes (Signed)
Tim Heath Date of Birth: 06-Nov-1960   History of Present Illness: Tim Heath is seen today for followup of CAD. He has a history of coronary disease with stenting of the right coronary in 2003 with a Cypher stent. He had stenting of the ramus intermediate branch in 2004 with a Taxus stent. Cardiac catheterization in March 2014 showed nonobstructive disease. In July of 2017 he presented with chest pain and ruled out for MI. A Myoview study was felt to be intermediate risk. He underwent cardiac cath which showed nonobstructive CAD and patent stents in the RCA and ramus intermediate vessels.   He was admitted 4/8-01/30/18 with chest pain. He ruled out for MI. Echo was unremarkable. Myoview was low risk. Beta blocker was discontinued due to bradycardia. 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   On follow up today he is doing very well. Since he has changed his eating habits and lifestyle he has lost 125 lbs. He is off diabetic medication. He is much more active. His BP has been well controlled off metoprolol. He feels great.   Current Outpatient Medications on File Prior to Visit  Medication Sig Dispense Refill  . aspirin 81 MG tablet Take 81 mg by mouth daily.      . Cyanocobalamin (B-12) 2500 MCG TABS Take 1 tablet by mouth daily.    . Multiple Vitamin (MULTIVITAMIN) tablet Take 1 tablet by mouth daily.      . nitroGLYCERIN (NITROSTAT) 0.4 MG SL tablet Place 1 tablet (0.4 mg total) under the tongue every 5 (five) minutes as needed for chest pain. 100 tablet 0  . omeprazole (PRILOSEC) 20 MG capsule Take 1 capsule (20 mg total) by mouth daily. 90 capsule 3   No current facility-administered medications on file prior to visit.     No Known Allergies  Past Medical History:  Diagnosis Date  . Coronary artery disease    a. DES-RCA 2003 b. DES-RI 2004/ normal LVF c. cath 04/2016: nonobstructive CAD w/ patent stents in RI and RCA.   Marland Kitchen GERD (gastroesophageal reflux  disease)   . History of Bell's palsy   . History of hiatal hernia   . Hyperlipidemia   . Hypertension   . Left ureteral stone     Past Surgical History:  Procedure Laterality Date  . CARDIAC CATHETERIZATION  04-12-2004  &  01-18-2013  dr Swaziland   Non-obstructive CAD/  continued patency of stents fo RCA and Intermediate branch/  nomral LVF, ef 55-65%  . CARDIAC CATHETERIZATION N/A 04/25/2016   Procedure: Left Heart Cath and Coronary Angiography;  Surgeon:  M Swaziland, MD;  Location: [facility] INVASIVE CV LAB;  Service: Cardiovascular;  Laterality: N/A;  . CARDIOVASCULAR STRESS TEST  last one 06-06-2011   dr Swaziland   normal perfusion study/  no ischemia or scar/  normal LV function and wall motion, ef 66%  . CORONARY ANGIOPLASTY WITH STENT PLACEMENT  07-30-2002  dr Sw[place]iland   abnormal cardiolite (inferior wall ischemia)  single-vessel CAD ,  balloon dilation and DES to RCA/  mild disease LAD 30-40%, non-obstructive/  perserved LV, ef 65%  . CORONARY ANGIOPLASTY WITH STENT PLACEMENT  05-16-2003  dr Swaziland   DES x1 to Intermediate branch (90%)/  pLAD 40-50%,  mD1  50%,  patent RCA stent/  normal LVF  . CYSTOSCOPY/URETEROSCOPY/HOLMIUM LASER/STENT PLACEMENT Left 04/28/2015   Procedure: CYSTOSCOPY/LEFT RETROGRADE LEFT URETEROSCOPY/STENT PLACEMENT/BLADDER BIOPSY WITH FULGERATION;  Surgeon: Jerilee Field, MD;  Location: [facility];  Service: Urology;  Laterality: Left;  . LAPAROSCOPIC CHOLECYSTECTOMY  04-01-2003  . TONSILLECTOMY      Social History   Tobacco Use  Smoking Status Former Smoker  . Last attempt to quit: 03/29/1986  . Years since quitting: 31.9  Smokeless Tobacco Never Used    Social History   Substance and Sexual Activity  Alcohol Use No  . Alcohol/week: 0.0 oz    Family History  Problem Relation Age of Onset  . Liver cancer Mother   . Emphysema Father   . Hypertension Brother   . Coronary artery disease Brother     Review of Systems: As noted in history  of present illness. All other systems were reviewed and are negative.  Physical Exam: BP 121/77   Pulse 66   Ht  (1.727 m)   Wt 190 lb 9.6 oz (86.5 kg)   BMI 28.98 kg/m  GENERAL:  Well appearing HEENT:  PERRL, EOMI, sclera are clear. Oropharynx is clear. NECK:  No jugular venous distention, carotid upstroke brisk and symmetric, no bruits, no thyromegaly or adenopathy LUNGS:  Clear to auscultation bilaterally CHEST:  Unremarkable HEART:  RRR,  PMI not displaced or sustained,S1 and S2 within normal limits, no S3, no S4: no clicks, no rubs, no murmurs ABD:  Soft, nontender. BS +, no masses or bruits. No hepatomegaly, no splenomegaly EXT:  2 + pulses throughout, no edema, no cyanosis no clubbing SKIN:  Warm and dry.  No rashes NEURO:  Alert and oriented x 3. Cranial nerves II through XII intact. PSYCH:  Cognitively intact   LABORATORY DATA:   Lab Results  Component Value Date   WBC 5.3 01/28/2018   HGB 15.3 01/28/2018   HCT 46.3 01/28/2018   PLT 170 01/28/2018   GLUCOSE 82 01/28/2018   CHOL 99 01/29/2018   TRIG 46 01/29/2018   HDL 33 (L) 01/29/2018   LDLCALC 57 01/29/2018   ALT 51 (H) 11/23/2015   AST 31 11/23/2015   NA 138 01/28/2018   K 3.9 01/28/2018   CL 104 01/28/2018   CREATININE 0.68 01/28/2018   BUN 16 01/28/2018   CO2 24 01/28/2018   TSH 1.066 04/23/2016   INR 0.95 04/23/2016   HGBA1C 5.5 01/29/2018   Labs reviewed from primary care: A1c 6.5%. Cholesterol 137, triglycerides 52, HDL 34, LDL 92. Glucose 124.  Dated 07/10/17: cholesterol 113, triglycerides 68, HDL 31, LDL 68. A1c 6.1%. CBC and CMET normal.   Ecg today shows sinus brady with HR 52. Otherwise normal. I have personally reviewed and interpreted this study.  Echo 01/29/18: Study Conclusions  - Left ventricle: The cavity size was normal. Systolic function was   normal. The estimated ejection fraction was in the range of 60%   to 65%. Wall motion was normal; there were no regional wall   motion  abnormalities. Left ventricular diastolic function   parameters were normal.   Myoview 01/29/18: T wave inversion was noted during stress in the III, aVF, V3, V4, V5 and V6 leads, beginning at 1 minutes of stress, ending at 3 minutes of stress, and returning to baseline after 1-5 mins of recovery.  Defect 1: There is a small defect of mild severity present in the apical anterior and apex location.  Findings consistent with very mild ischemia in the LAD territory.  This is a low risk study. The left ventricular ejection fraction is normal (55-65%).  Assessment / Plan: 1. Coronary disease status post stenting procedures as noted above.  Cardiac  catheterization in July 2017 showed nonobstructive disease.  Recent Myoview was low risk. Echo was unremarkable. He is currently asymptomatic. Continue risk factor modification.  2. Morbid obesity. Fantastic weight loss. Continue current lifestyle changes.   3. Hyperlipidemia. Lipids are excellent.   4. Hypertension-BP is well controlled. Will reduce lisinopril to 10 mg daily and follow.   5. DM type 2. Off medication now with weight loss.   I will follow up in 6 months.

## 2018-02-19 ENCOUNTER — Ambulatory Visit: Payer: Managed Care, Other (non HMO) | Admitting: Cardiology

## 2018-02-19 ENCOUNTER — Encounter: Payer: Self-pay | Admitting: Cardiology

## 2018-02-19 VITALS — BP 121/77 | HR 66 | Ht 68.0 in | Wt 190.6 lb

## 2018-02-19 DIAGNOSIS — I10 Essential (primary) hypertension: Secondary | ICD-10-CM | POA: Diagnosis not present

## 2018-02-19 DIAGNOSIS — I2510 Atherosclerotic heart disease of native coronary artery without angina pectoris: Secondary | ICD-10-CM | POA: Diagnosis not present

## 2018-02-19 DIAGNOSIS — E785 Hyperlipidemia, unspecified: Secondary | ICD-10-CM | POA: Diagnosis not present

## 2018-02-19 MED ORDER — ATORVASTATIN CALCIUM 80 MG PO TABS: 80.0000 mg | ORAL_TABLET | Freq: Every day | ORAL | 3 refills | Status: DC

## 2018-02-19 MED ORDER — CLOPIDOGREL BISULFATE 75 MG PO TABS: 75.0000 mg | ORAL_TABLET | Freq: Every day | ORAL | 3 refills | Status: DC

## 2018-02-19 MED ORDER — LISINOPRIL 10 MG PO TABS: 10.0000 mg | ORAL_TABLET | Freq: Every day | ORAL | 3 refills | Status: DC

## 2018-02-19 NOTE — Patient Instructions (Signed)
Reduce lisinopril to 10 mg daily  I will follow up in 6 months

## 2018-11-30 NOTE — Progress Notes (Signed)
Tim Heath Date of Birth: [birth year]   History of Present Illness: Tim Heath is seen today for followup of CAD. He has a history of coronary disease with stenting of the right coronary in 2003 with a Cypher stent. He had stenting of the ramus intermediate branch in 2004 with a Taxus stent. Cardiac catheterization in March 2014 showed nonobstructive disease. In July of 2017 he presented with chest pain and ruled out for MI. A Myoview study was felt to be intermediate risk. He underwent cardiac cath which showed nonobstructive CAD and patent stents in the RCA and ramus intermediate vessels.   He was admitted 4/8-01/30/18 with chest pain. He ruled out for MI. Echo was unremarkable. Myoview was low risk. Beta blocker was discontinued due to bradycardia. 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 When last seen he had lost 125 lbs. Was able to come off diabetic medication. We reduced his lisinopril by half.  On follow up today he is doing very well. He has maintained his new lifestyle and kept the weight off. BP has been excellent. No chest pain, dyspnea or palpitations. Feels great.   Current Outpatient Medications on File Prior to Visit  Medication Sig Dispense Refill  . aspirin 81 MG tablet Take 81 mg by mouth daily.      Marland Kitchen. atorvastatin (LIPITOR) 80 MG tablet Take 1 tablet (80 mg total) by mouth daily. 90 tablet 3  . clopidogrel (PLAVIX) 75 MG tablet Take 1 tablet (75 mg total) by mouth daily. 90 tablet 3  . Cyanocobalamin (B-12) 2500 MCG TABS Take 1 tablet by mouth daily.    Marland Kitchen. lisinopril (PRINIVIL,ZESTRIL) 10 MG tablet Take 1 tablet (10 mg total) by mouth daily. 90 tablet 3  . Multiple Vitamin (MULTIVITAMIN) tablet Take 1 tablet by mouth daily.      . nitroGLYCERIN (NITROSTAT) 0.4 MG SL tablet Place 1 tablet (0.4 mg total) under the tongue every 5 (five) minutes as needed for chest pain. 100 tablet 0  . omeprazole (PRILOSEC) 20 MG capsule Take 1 capsule (20 mg total)  by mouth daily. 90 capsule 3   No current facility-administered medications on file prior to visit.     No Known Allergies  Past Medical History:  Diagnosis Date  . Coronary artery disease    a. DES-RCA 2003 b. DES-RI 2004/ normal LVF c. cath 04/2016: nonobstructive CAD w/ patent stents in RI and RCA.   Marland Kitchen. GERD (gastroesophageal reflux disease)   . History of Bell's palsy   . History of hiatal hernia   . Hyperlipidemia   . Hypertension   . Left ureteral stone     Past Surgical History:  Procedure Laterality Date  . CARDIAC CATHETERIZATION  04-12-2004  &  01-18-2013  dr Swaz[place]andjordan   Non-obstructive CAD/  continued patency of stents fo RCA and Intermediate branch/  nomral LVF, ef 55-65%  . CARDIAC CATHETERIZATION N/A 04/25/2016   Procedure: Left Heart Cath and Coronary Angiography;  Surgeon:  M SwazilandJordan, MD;  [facility];  Service: Cardiovascular;  Laterality: N/A;  . CARDIOVASCULAR STRESS TEST  last one 06-06-2011   dr [place]andjordan   normal perfusion study/  no ischemia or scar/  normal LV function and wall motion, ef 66%  . CORONARY ANGIOPLASTY WITH STENT PLACEMENT  07-30-2002  dr [place]andjordan   abnormal cardiolite (inferior wall ischemia)  single-vessel CAD ,  balloon dilation and DES to RCA/  mild disease LAD 30-40%, non-obstructive/  perserved LV,  ef 65%  . CORONARY ANGIOPLASTY WITH STENT PLACEMENT  05-16-2003  dr Swaz[place]andjordan   DES x1 to Intermediate branch (90%)/  pLAD 40-50%,  mD1  50%,  patent RCA stent/  normal LVF  . CYSTOSCOPY/URETEROSCOPY/HOLMIUM LASER/STENT PLACEMENT Left 04/28/2015   Procedure: CYSTOSCOPY/LEFT RETROGRADE LEFT URETEROSCOPY/STENT PLACEMENT/BLADDER BIOPSY WITH FULGERATION;  Surgeon: Jerilee FieldMatthew Eskridge, MD;  [facility];  Service: Urology;  Laterality: Left;  . LAPAROSCOPIC CHOLECYSTECTOMY  04-01-2003  . TONSILLECTOMY      Social History   Tobacco Use  Smoking Status Former Smoker  . Last attempt to quit: 03/29/1986  . Years since  quitting: 32.6  Smokeless Tobacco Never Used    Social History   Substance and Sexual Activity  Alcohol Use No  . Alcohol/week: 0.0 standard drinks    Family History  Problem Relation Age of Onset  . Liver cancer Mother   . Emphysema Father   . Hypertension Brother   . Coronary artery disease Brother     Review of Systems: As noted in history of present illness. All other systems were reviewed and are negative.  Physical Exam: There were no vitals taken for this visit. GENERAL:  Well appearing WM in NAD HEENT:  PERRL, EOMI, sclera are clear. Oropharynx is clear. NECK:  No jugular venous distention, carotid upstroke brisk and symmetric, no bruits, no thyromegaly or adenopathy LUNGS:  Clear to auscultation bilaterally CHEST:  Unremarkable HEART:  RRR,  PMI not displaced or sustained,S1 and S2 within normal limits, no S3, no S4: no clicks, no rubs, no murmurs ABD:  Soft, nontender. BS +, no masses or bruits. No hepatomegaly, no splenomegaly EXT:  2 + pulses throughout, no edema, no cyanosis no clubbing SKIN:  Warm and dry.  No rashes NEURO:  Alert and oriented x 3. Cranial nerves II through XII intact. PSYCH:  Cognitively intact     LABORATORY DATA:   Lab Results  Component Value Date   WBC 5.3 01/28/2018   HGB 15.3 01/28/2018   HCT 46.3 01/28/2018   PLT 170 01/28/2018   GLUCOSE 82 01/28/2018   CHOL 99 01/29/2018   TRIG 46 01/29/2018   HDL 33 (L) 01/29/2018   LDLCALC 57 01/29/2018   ALT 51 (H) 11/23/2015   AST 31 11/23/2015   NA 138 01/28/2018   K 3.9 01/28/2018   CL 104 01/28/2018   CREATININE 0.68 01/28/2018   BUN 16 01/28/2018   CO2 24 01/28/2018   TSH 1.066 04/23/2016   INR 0.95 04/23/2016   HGBA1C 5.5 01/29/2018   Labs reviewed from primary care: A1c 6.5%. Cholesterol 137, triglycerides 52, HDL 34, LDL 92. Glucose 124.  Dated 07/10/17: cholesterol 113, triglycerides 68, HDL 31, LDL 68. A1c 6.1%. CBC and CMET normal.  Dated 04/23/18: normal chemistries  and TSH.  Dated 10/29/18: A1c 5.4%. Ecg today shows sinus brady with HR 52. Otherwise normal. I have personally reviewed and interpreted this study.  Ecg today shows NSR with normal Ecg. I have personally reviewed and interpreted this study.   Echo 01/29/18: Study Conclusions  - Left ventricle: The cavity size was normal. Systolic function was   normal. The estimated ejection fraction was in the range of 60%   to 65%. Wall motion was normal; there were no regional wall   motion abnormalities. Left ventricular diastolic function   parameters were normal.   Myoview 01/29/18: T wave inversion was noted during stress in the III, aVF, V3, V4, V5 and V6 leads, beginning at 1  minutes of stress, ending at 3 minutes of stress, and returning to baseline after 1-5 mins of recovery.  Defect 1: There is a small defect of mild severity present in the apical anterior and apex location.  Findings consistent with very mild ischemia in the LAD territory.  This is a low risk study. The left ventricular ejection fraction is normal (55-65%).  Assessment / Plan: 1. Coronary disease status post stenting procedures as noted above.  Cardiac catheterization in July 2017 showed nonobstructive disease.  Last Myoview was low risk. Echo was unremarkable. He is currently asymptomatic. We have opted to continue DAPT since he has first generation DES. Continue risk factor modification.  2. Morbid obesity. Maintaining weight loss. Continue current lifestyle changes.   3. Hyperlipidemia. Last  Lipids were excellent.   4. Hypertension-BP is well controlled.  Will stop lisinopril at this time and monitor. With his weight loss and lifestyle changes he may no longer need medical therapy.   5. DM type 2. Off medication now with weight loss.   I will follow up in one year

## 2018-12-04 ENCOUNTER — Ambulatory Visit: Payer: Managed Care, Other (non HMO) | Admitting: Cardiology

## 2018-12-04 ENCOUNTER — Encounter: Payer: Self-pay | Admitting: Cardiology

## 2018-12-04 VITALS — BP 110/68 | HR 58 | Ht 68.5 in | Wt 193.8 lb

## 2018-12-04 DIAGNOSIS — E785 Hyperlipidemia, unspecified: Secondary | ICD-10-CM | POA: Diagnosis not present

## 2018-12-04 DIAGNOSIS — I2510 Atherosclerotic heart disease of native coronary artery without angina pectoris: Secondary | ICD-10-CM

## 2018-12-04 NOTE — Patient Instructions (Signed)
Stop taking lisinopril   Continue your other therapy  Follow up in one year

## 2019-03-02 ENCOUNTER — Other Ambulatory Visit: Payer: Self-pay | Admitting: Cardiology

## 2019-08-05 ENCOUNTER — Other Ambulatory Visit: Payer: Self-pay | Admitting: Cardiology

## 2019-08-05 MED ORDER — NITROGLYCERIN 0.4 MG SL SUBL: 0.4000 mg | SUBLINGUAL_TABLET | SUBLINGUAL | 0 refills | Status: DC | PRN

## 2019-08-05 NOTE — Telephone Encounter (Signed)
°*  STAT* If patient is at the pharmacy, call can be transferred to refill team.   1. Which medications need to be refilled? (please list name of each medication and dose if known) Nitroglycerin  2. Which pharmacy/location (including street and city if local pharmacy) is medication to be sent to? [facility] [place]  3. Do they need a 30 day or 90 day supply?  y in a pack

## 2019-08-06 ENCOUNTER — Other Ambulatory Visit: Payer: Self-pay | Admitting: Cardiology

## 2019-08-07 ENCOUNTER — Other Ambulatory Visit: Payer: Self-pay | Admitting: Cardiology

## 2020-01-24 NOTE — Progress Notes (Signed)
Virtual Visit via Telephone Note   This visit type was conducted due to national recommendations for restrictions regarding the COVID-19 Pandemic (e.g. social distancing) in an effort to limit this patient's exposure and mitigate transmission in our community.  Due to his co-morbid illnesses, this patient is at least at moderate risk for complications without adequate follow up.  This format is felt to be most appropriate for this patient at this time.  The patient did not have access to video technology/had technical difficulties with video requiring transitioning to audio format only (telephone).  All issues noted in this document were discussed and addressed.  No physical exam could be performed with this format.  Please refer to the patient's chart for his  consent to telehealth for [facility].   The patient was identified using 2 identifiers.  Date:  01/27/2020   ID:  Tim Heath, DOB Mar 15, 1961, MRN [id number]  Patient Location: Home Provider Location: Home  PCP:  Johny Blamer, MD  Cardiologist:  Chace Klippel [place], MD  Electrophysiologist:  None   Evaluation Performed:  Follow-Up Visit  Chief Complaint:  Follow up CAD  History of Present Illness:    Tim Heath is a 59 y.o. male with history of coronary disease with stenting of the right coronary in 2003 with a Cypher stent. He had stenting of the ramus intermediate branch in 2004 with a Taxus stent. Cardiac catheterization in March 2014 showed nonobstructive disease. In July of 2017 he presented with chest pain and ruled out for MI. A Myoview study was felt to be intermediate risk. He underwent cardiac cath which showed nonobstructive CAD and patent stents in the RCA and ramus intermediate vessels.   He was admitted 4/8-01/30/18 with chest pain. He ruled out for MI. Echo was unremarkable. Myoview was low risk. Beta blocker was discontinued due to bradycardia. CXR showed bony infarcts vs cartilagenous lesion in the humeral  heads. When last seen he had lost 125 lbs. Was able to come off diabetic medication. He did have to go back on lisinopril for BP.    On follow up today he recently had Covid 19 infection. He had fatigue, HA  No appetite, aching, and severe cough. He states he felt real bad but is better now.    He has gained some weight back this winter but is planning to lose more now that the weather is better. He denies any chest pain.   The patient does  have symptoms concerning for COVID-19 infection recently as noted. Still quite fatigued.    Past Medical History:  Diagnosis Date  . Coronary artery disease    a. DES-RCA 2003 b. DES-RI 2004/ normal LVF c. cath 04/2016: nonobstructive CAD w/ patent stents in RI and RCA.   Marland Kitchen GERD (gastroesophageal reflux disease)   . History of Bell's palsy   . History of hiatal hernia   . Hyperlipidemia   . Hypertension   . Left ureteral stone    Past Surgical History:  Procedure Laterality Date  . CARDIAC CATHETERIZATION  04-12-2004  &  01-18-2013  dr Swaziland   Non-obstructive CAD/  continued patency of stents fo RCA and Intermediate branch/  nomral LVF, ef 55-65%  . CARDIAC CATHETERIZATION N/A 04/25/2016   Procedure: Left Heart Cath and Coronary Angiography;  Surgeon: Bertel Venard M [place]az[place], MD;  Location[facility] CV LAB;  Service: Cardiovascular;  Laterality: N/A;  . CARDIOVASCULAR STRESS TEST  last one 06-06-2011   dr [place]and   normal perfusion study/  no ischemia or scar/  normal LV function and wall motion, ef 66%  . CORONARY ANGIOPLASTY WITH STENT PLACEMENT  07-30-2002  dr [place]and   abnormal cardiolite (inferior wall ischemia)  single-vessel CAD ,  balloon dilation and DES to RCA/  mild disease LAD 30-40%, non-obstructive/  perserved LV, ef 65%  . CORONARY ANGIOPLASTY WITH STENT PLACEMENT  05-16-2003  dr [place]   DES x1 to Intermediate branch (90%)/  pLAD 40-50%,  mD1  50%,  patent RCA stent/  normal LVF  . CYSTOSCOPY/URETEROSCOPY/HOLMIUM LASER/STENT PLACEMENT Left  04/28/2015   Procedure: CYSTOSCOPY/LEFT RETROGRADE LEFT URETEROSCOPY/STENT PLACEMENT/BLADDER BIOPSY WITH FULGERATION;  Surgeon: Jerilee Field, MD;  Location: [facility];  Service: Urology;  Laterality: Left;  . LAPAROSCOPIC CHOLECYSTECTOMY  04-01-2003  . TONSILLECTOMY       Current Meds  Medication Sig  . aspirin 81 MG tablet Take 81 mg by mouth daily.    Marland Kitchen atorvastatin (LIPITOR) 80 MG tablet Take 1 tablet by mouth once daily  . clopidogrel (PLAVIX) 75 MG tablet Take 1 tablet by mouth once daily  . Cyanocobalamin (B-12) 2500 MCG TABS Take 1 tablet by mouth daily.  Marland Kitchen lisinopril (ZESTRIL) 20 MG tablet Take 20 mg by mouth daily.  . Multiple Vitamin (MULTIVITAMIN) tablet Take 1 tablet by mouth daily.    . nitroGLYCERIN (NITROSTAT) 0.4 MG SL tablet Place 1 tablet (0.4 mg total) under the tongue every 5 (five) minutes as needed for chest pain.     Allergies:   Patient has no known allergies.   Social History   Tobacco Use  . Smoking status: Former Smoker    Quit date: 03/29/1986    Years since quitting: 33.8  . Smokeless tobacco: Never Used  Substance Use Topics  . Alcohol use: No    Alcohol/week: 0.0 standard drinks  . Drug use: No     Family Hx: The patient's family history includes Coronary artery disease in his brother; Emphysema in his father; Hypertension in his brother; Liver cancer in his mother.  ROS:   Please see the history of present illness.     All other systems reviewed and are negative.   Prior CV studies:   The following studies were reviewed today:  Echo 01/29/18: Study Conclusions  - Left ventricle: The cavity size was normal. Systolic function was normal. The estimated ejection fraction was in the range of 60% to 65%. Wall motion was normal; there were no regional wall motion abnormalities. Left ventricular diastolic function parameters were normal.   Myoview 01/29/18: T wave inversion was noted during stress in the III, aVF, V3,  V4, V5 and V6 leads, beginning at 1 minutes of stress, ending at 3 minutes of stress, and returning to baseline after 1-5 mins of recovery.  Defect 1: There is a small defect of mild severity present in the apical anterior and apex location.  Findings consistent with very mild ischemia in the LAD territory.  This is a low risk study. The left ventricular ejection fraction is normal (55-65%).  Labs/Other Tests and Data Reviewed:    EKG:  No ECG reviewed.  Recent Labs: No results found for requested labs within last 8760 hours.   Recent Lipid Panel Lab Results  Component Value Date/Time   CHOL 99 01/29/2018 04:22 AM   TRIG 46 01/29/2018 04:22 AM   HDL 33 (L) 01/29/2018 04:22 AM   CHOLHDL 3.0 01/29/2018 04:22 AM   LDLCALC 57 01/29/2018 04:22 AM   Labs reviewed from primary care:  A1c 6.5%. Cholesterol 137, triglycerides 52, HDL 34, LDL 92. Glucose 124.  Dated 07/10/17: cholesterol 113, triglycerides 68, HDL 31, LDL 68. A1c 6.1%. CBC and CMET normal.  Dated 04/23/18: normal chemistries and TSH.  Dated 10/29/18: A1c 5.4%. Dated 08/05/19: cholesterol 106, triglycerides 47, HDL 44, LDL 52. A1c 5.8%. CBC, CMET, TSH normal.  Wt Readings from Last 3 Encounters:  01/27/20 222 lb (100.7 kg)  12/04/18 193 lb 12.8 oz (87.9 kg)  02/19/18 190 lb 9.6 oz (86.5 kg)     Objective:    Vital Signs:  BP 110/68   Pulse 64   Ht 5\' 8"  (1.727 m)   Wt 222 lb (100.7 kg)   BMI 33.75 kg/m    VITAL SIGNS:  reviewed  ASSESSMENT & PLAN:    1. Coronary disease status post stenting procedures as noted above.  Cardiac catheterization in July 2017 showed nonobstructive disease.  Last Myoview was low risk. Echo was unremarkable. He is currently asymptomatic. We have opted to continue DAPT since he has first generation DES. Continue risk factor modification.  2. Morbid obesity. continue to focus on healthy eating and weight control.  Continue current lifestyle changes.   3. Hyperlipidemia. Last  Lipids  were excellent.   4. Hypertension-BP is well controlled.  continue lisinopril.   5. DM type 2. Off medication now with weight loss.   6. Covid 19 infection. Improving.   COVID-19 Education: The signs and symptoms of COVID-19 were discussed with the patient and how to seek care for testing (follow up with PCP or arrange E-visit).  The importance of social distancing was discussed today.  Time:   Today, I have spent 15 minutes with the patient with telehealth technology discussing the above problems.     Medication Adjustments/Labs and Tests Ordered: Current medicines are reviewed at length with the patient today.  Concerns regarding medicines are outlined above.   Tests Ordered: No orders of the defined types were placed in this encounter.   Medication Changes: No orders of the defined types were placed in this encounter.   Follow Up:  In Person in 1 year(s)  Signed, Decarla Siemen [place], MD  01/27/2020 8:12 AM    [place]

## 2020-01-27 ENCOUNTER — Encounter: Payer: Self-pay | Admitting: Cardiology

## 2020-01-27 ENCOUNTER — Telehealth (INDEPENDENT_AMBULATORY_CARE_PROVIDER_SITE_OTHER): Payer: Managed Care, Other (non HMO) | Admitting: Cardiology

## 2020-01-27 ENCOUNTER — Telehealth: Payer: Managed Care, Other (non HMO) | Admitting: Cardiology

## 2020-01-27 VITALS — BP 110/68 | HR 64 | Ht 68.0 in | Wt 222.0 lb

## 2020-01-27 DIAGNOSIS — I10 Essential (primary) hypertension: Secondary | ICD-10-CM

## 2020-01-27 DIAGNOSIS — I2510 Atherosclerotic heart disease of native coronary artery without angina pectoris: Secondary | ICD-10-CM

## 2020-01-27 DIAGNOSIS — E785 Hyperlipidemia, unspecified: Secondary | ICD-10-CM | POA: Diagnosis not present

## 2020-01-27 MED ORDER — CLOPIDOGREL BISULFATE 75 MG PO TABS: 75.0000 mg | ORAL_TABLET | Freq: Every day | ORAL | 3 refills | Status: DC

## 2020-01-27 MED ORDER — ATORVASTATIN CALCIUM 80 MG PO TABS: 80.0000 mg | ORAL_TABLET | Freq: Every day | ORAL | 3 refills | Status: DC

## 2020-01-27 MED ORDER — NITROGLYCERIN 0.4 MG SL SUBL: 0.4000 mg | SUBLINGUAL_TABLET | SUBLINGUAL | 11 refills | Status: DC | PRN

## 2020-01-27 NOTE — Addendum Note (Signed)
Addended by: Neoma Laming on: 01/27/2020 09:59 AM   Modules accepted: Orders

## 2020-01-27 NOTE — Patient Instructions (Signed)
Medication Instructions:  Continue same medications *If you need a refill on your cardiac medications before your next appointment, please call your pharmacy*   Lab Work: None ordered    Testing/Procedures: None ordered   Follow-Up: At [facility], you and your health needs are our priority.  As part of our continuing mission to provide you with exceptional heart care, we have created designated Provider Care Teams.  These Care Teams include your primary Cardiologist (physician) and Advanced Practice Providers (APPs -  Physician Assistants and Nurse Practitioners) who all work together to provide you with the care you need, when you need it.  We recommend signing up for the patient portal called "MyChart".  Sign up information is provided on this After Visit Summary.  MyChart is used to connect with patients for Virtual Visits (Telemedicine).  Patients are able to view lab/test results, encounter notes, upcoming appointments, etc.  Non-urgent messages can be sent to your provider as well.   To learn more about what you can do with MyChart, go to [email or web address].    Your next appointment:  1 year    Call in Jan to schedule April appointment    The format for your next appointment: Office    Provider:  Dr.Jordan   

## 2020-12-10 ENCOUNTER — Emergency Department (HOSPITAL_COMMUNITY): Payer: Managed Care, Other (non HMO)

## 2020-12-10 ENCOUNTER — Emergency Department (HOSPITAL_COMMUNITY)
Admission: EM | Admit: 2020-12-10 | Discharge: 2020-12-10 | Disposition: A | Discharge status: Home / Self Care | Payer: Managed Care, Other (non HMO) | Attending: Emergency Medicine | Admitting: Emergency Medicine

## 2020-12-10 ENCOUNTER — Encounter (HOSPITAL_COMMUNITY): Payer: Self-pay | Admitting: Emergency Medicine

## 2020-12-10 ENCOUNTER — Other Ambulatory Visit: Payer: Self-pay

## 2020-12-10 DIAGNOSIS — Z79899 Other long term (current) drug therapy: Secondary | ICD-10-CM | POA: Diagnosis not present

## 2020-12-10 DIAGNOSIS — R42 Dizziness and giddiness: Secondary | ICD-10-CM | POA: Insufficient documentation

## 2020-12-10 DIAGNOSIS — Z7982 Long term (current) use of aspirin: Secondary | ICD-10-CM | POA: Insufficient documentation

## 2020-12-10 DIAGNOSIS — I2510 Atherosclerotic heart disease of native coronary artery without angina pectoris: Secondary | ICD-10-CM | POA: Insufficient documentation

## 2020-12-10 DIAGNOSIS — Z87891 Personal history of nicotine dependence: Secondary | ICD-10-CM | POA: Diagnosis not present

## 2020-12-10 DIAGNOSIS — R202 Paresthesia of skin: Secondary | ICD-10-CM | POA: Insufficient documentation

## 2020-12-10 DIAGNOSIS — I10 Essential (primary) hypertension: Secondary | ICD-10-CM | POA: Diagnosis not present

## 2020-12-10 DIAGNOSIS — R0789 Other chest pain: Secondary | ICD-10-CM | POA: Insufficient documentation

## 2020-12-10 DIAGNOSIS — E119 Type 2 diabetes mellitus without complications: Secondary | ICD-10-CM | POA: Diagnosis not present

## 2020-12-10 DIAGNOSIS — Z955 Presence of coronary angioplasty implant and graft: Secondary | ICD-10-CM | POA: Diagnosis not present

## 2020-12-10 DIAGNOSIS — R110 Nausea: Secondary | ICD-10-CM | POA: Insufficient documentation

## 2020-12-10 LAB — CBC
HCT: 47.1 % (ref 39.0–52.0)
Hemoglobin: 16.1 g/dL (ref 13.0–17.0)
MCH: 32.8 pg (ref 26.0–34.0)
MCHC: 34.2 g/dL (ref 30.0–36.0)
MCV: 95.9 fL (ref 80.0–100.0)
Platelets: 199 10*3/uL (ref 150–400)
RBC: 4.91 MIL/uL (ref 4.22–5.81)
RDW: 12.6 % (ref 11.5–15.5)
WBC: 6.6 10*3/uL (ref 4.0–10.5)
nRBC: 0 % (ref 0.0–0.2)

## 2020-12-10 LAB — BASIC METABOLIC PANEL
Anion gap: 9 (ref 5–15)
BUN: 19 mg/dL (ref 6–20)
CO2: 26 mmol/L (ref 22–32)
Calcium: 9.3 mg/dL (ref 8.9–10.3)
Chloride: 106 mmol/L (ref 98–111)
Creatinine, Ser: 0.73 mg/dL (ref 0.61–1.24)
GFR, Estimated: >60 mL/min (ref >60)
Glucose, Bld: 108 mg/dL — ABNORMAL HIGH (ref 70–99)
Potassium: 4.3 mmol/L (ref 3.5–5.1)
Sodium: 141 mmol/L (ref 135–145)

## 2020-12-10 LAB — TROPONIN I (HIGH SENSITIVITY)
Troponin I (High Sensitivity): 3 ng/L (ref <18)
Troponin I (High Sensitivity): 4 ng/L (ref <18)

## 2020-12-10 NOTE — ED Triage Notes (Signed)
Patient complains of lightheadedness, tingling in left arm, and chest tightness that started yesterday. History of cardiac stents. Alert, oriented, and ambulatory at this time.

## 2020-12-10 NOTE — ED Provider Notes (Signed)
[facility] EMERGENCY DEPARTMENT Provider Note   CSN: [id number] Arrival date & time: 12/10/20  0753     History Chief Complaint  Patient presents with  . Chest Pain    Tim Heath is a 60 y.o. male.  The history is provided by the patient and medical records. No language interpreter was used.  Chest Pain    60 year old male significant history of CAD status post drug-eluting stent, GERD, hiatal hernia, hypertension, presenting for evaluation of chest pain.  Patient report for the past 2 days he has had intermittent chest discomfort.  He described as a pressure sensation across his chest with some tingling sensation to his left arm.  He did endorse some mild nausea with it and reports some lightheadedness.  These episodes usually lasting for less than 30 minutes.  It is waxing and waning.  This morning he did try taking sublingual nitro without any improvement.  He did report noticing that his blood pressure was elevated in the 160s systolic yesterday which concerns him.  He denies any fever, productive cough, abdominal pain, postprandial pain.  No alcohol or tobacco use.  He has been compliant with his medication.  At this time he denies any active chest pain but did report some tingling sensation to his left arm.  He is right-hand dominant.  He is taking Plavix.  Patient has been fully vaccinated for COVID-19.  Past Medical History:  Diagnosis Date  . Coronary artery disease    a. DES-RCA 2003 b. DES-RI 2004/ normal LVF c. cath 04/2016: nonobstructive CAD w/ patent stents in RI and RCA.   Marland Kitchen GERD (gastroesophageal reflux disease)   . History of Bell's palsy   . History of hiatal hernia   . Hyperlipidemia   . Hypertension   . Left ureteral stone     Patient Active Problem List   Diagnosis Date Noted  . Diabetes mellitus without complication (HCC) 01/28/2018  . Chest pain 01/28/2018  . Bone lesion of humerus 01/28/2018  . Type 2 diabetes mellitus with  complication, without long-term current use of insulin (HCC) 01/09/2017  . Hyperlipidemia LDL goal <70 04/23/2016  . Abnormal nuclear cardiac imaging test 04/23/2016  . Angina at rest [facility]) 04/23/2016  . Angina pectoris, crescendo (HCC) 01/17/2013  . Hypertension   . Coronary artery disease   . Morbid obesity, unspecified obesity type [facility])     Past Surgical History:  Procedure Laterality Date  . CARDIAC CATHETERIZATION  04-12-2004  &  01-18-2013  dr Swaziland   Non-obstructive CAD/  continued patency of stents fo RCA and Intermediate branch/  nomral LVF, ef 55-65%  . CARDIAC CATHETERIZATION N/A 04/25/2016   Procedure: Left Heart Cath and Coronary Angiography;  Surgeon: Peter M Swaziland, MD;  Location: [facility] INVASIVE CV LAB;  Service: Cardiovascular;  Laterality: N/A;  . CARDIOVASCULAR STRESS TEST  last one 06-06-2011   dr [place]and   normal perfusion study/  no ischemia or scar/  normal LV function and wall motion, ef 66%  . CORONARY ANGIOPLASTY WITH STENT PLACEMENT  07-30-2002  dr [place]   abnormal cardiolite (inferior wall ischemia)  single-vessel CAD ,  balloon dilation and DES to RCA/  mild disease LAD 30-40%, non-obstructive/  perserved LV, ef 65%  . CORONARY ANGIOPLASTY WITH STENT PLACEMENT  05-16-2003  dr [place]   DES x1 to Intermediate branch (90%)/  pLAD 40-50%,  mD1  50%,  patent RCA stent/  normal LVF  . CYSTOSCOPY/URETEROSCOPY/HOLMIUM LASER/STENT PLACEMENT Left 04/28/2015  Procedure: CYSTOSCOPY/LEFT RETROGRADE LEFT URETEROSCOPY/STENT PLACEMENT/BLADDER BIOPSY WITH FULGERATION;  Surgeon: Jerilee Field, MD;  Location: [facility];  Service: Urology;  Laterality: Left;  . LAPAROSCOPIC CHOLECYSTECTOMY  04-01-2003  . TONSILLECTOMY         Family History  Problem Relation Age of Onset  . Liver cancer Mother   . Emphysema Father   . Hypertension Brother   . Coronary artery disease Brother     Social History   Tobacco Use  . Smoking status: Former Smoker    Quit  date: 03/29/1986    Years since quitting: 34.7  . Smokeless tobacco: Never Used  Substance Use Topics  . Alcohol use: No    Alcohol/week: 0.0 standard drinks  . Drug use: No    Home Medications Prior to Admission medications   Medication Sig Start Date End Date Taking? Authorizing Provider  aspirin 81 MG tablet Take 81 mg by mouth daily.      [provider]  atorvastatin (LIPITOR) 80 MG tablet Take 1 tablet (80 mg total) by mouth daily. 01/27/20   Swaziland, Peter M, MD  clopidogrel (PLAVIX) 75 MG tablet Take 1 tablet (75 mg total) by mouth daily. 01/27/20   Sw[place]il[place], Peter M, MD  Cyanocobalamin (B-12) 2500 MCG TABS Take 1 tablet by mouth daily.    [provider]  lisinopril (ZESTRIL) 20 MG tablet Take 20 mg by mouth daily.    [provider]  Multiple Vitamin (MULTIVITAMIN) tablet Take 1 tablet by mouth daily.      [provider]  nitroGLYCERIN (NITROSTAT) 0.4 MG SL tablet Place 1 tablet (0.4 mg total) under the tongue every 5 (five) minutes as needed for chest pain. 01/27/20   Swaziland, Peter M, MD    Allergies    Patient has no known allergies.  Review of Systems   Review of Systems  Cardiovascular: Positive for chest pain.  All other systems reviewed and are negative.   Physical Exam Updated Vital Signs BP 124/82 (BP Location: Right Arm)   Pulse 78   Temp 98.2 F (36.8 C) (Oral)   Resp 16   SpO2 98%   Physical Exam Vitals and nursing note reviewed.  Constitutional:      General: He is not in acute distress.    Appearance: He is well-developed and well-nourished.  HENT:     Head: Atraumatic.  Eyes:     Conjunctiva/sclera: Conjunctivae normal.  Cardiovascular:     Rate and Rhythm: Normal rate and regular rhythm.     Heart sounds: Normal heart sounds.  Pulmonary:     Breath sounds: Normal breath sounds.  Abdominal:     Palpations: Abdomen is soft.     Tenderness: There is no abdominal tenderness.  Musculoskeletal:        General: No  tenderness.     Cervical back: Neck supple.  Skin:    Capillary Refill: Capillary refill takes less than 2 seconds.     Findings: No rash.  Neurological:     Mental Status: He is alert and oriented to person, place, and time.  Psychiatric:        Mood and Affect: Mood and affect and mood normal.     ED Results / Procedures / Treatments   Labs (all labs ordered are listed, but only abnormal results are displayed) Labs Reviewed  BASIC METABOLIC PANEL - Abnormal; Notable for the following components:      Result Value   Glucose, Bld 108 (*)  All other components within normal limits  CBC  TROPONIN I (HIGH SENSITIVITY)  TROPONIN I (HIGH SENSITIVITY)    EKG EKG Interpretation  Date/Time:  Thursday December 10 2020 08:05:04 EST Ventricular Rate:  78 PR Interval:  148 QRS Duration: 92 QT Interval:  356 QTC Calculation: 405 R Axis:   55 Text Interpretation: Normal sinus rhythm Normal ECG Confirmed by Benjiman Core [phone]) on 12/10/2020 9:50:18 AM   Radiology DG Chest 2 View  Result Date: 12/10/2020 CLINICAL DATA:  Chest pain for 2 days Former smoker EXAM: CHEST - 2 VIEW COMPARISON:  01/28/2018 FINDINGS: The heart size and mediastinal contours are within normal limits. Minimal linear opacities at the left lung base likely due to scarring. Lungs otherwise clear. The visualized skeletal structures are unremarkable. IMPRESSION: No acute cardiopulmonary process. Electronically Signed   By: Acquanetta Belling M.D.   On: 12/10/2020 08:34    Procedures Procedures   Medications Ordered in ED Medications - No data to display  ED Course  I have reviewed the triage vital signs and the nursing notes.  Pertinent labs & imaging results that were available during my care of the patient were reviewed by me and considered in my medical decision making (see chart for details).    MDM Rules/Calculators/A&P                          BP 127/77   Pulse 61   Temp 98.2 F (36.8 C) (Oral)    Resp 17   SpO2 99%   Final Clinical Impression(s) / ED Diagnoses Final diagnoses:  Atypical chest pain    Rx / DC Orders ED Discharge Orders    None     10:10 AM Patient with history of cardiac disease, heart score 4, here with intermittent chest discomfort and left arm tingling sensation.  No active chest pain at this time.  Patient is resting comfortably.  Initial EKG, troponin, chest x-ray and labs are reassuring.  Will obtain delta troponin.  1:20 PM Negative delta troponin. Will consult cardiology to help set up a close follow-up appointment for patient but otherwise he is stable for discharge. Care discussed with Dr. Rubin Payor.   I have reached out and spoke with [employer], Rosann Auerbach, who will have office call him to set up a close follow-up appointment for this patient per request. Work note provided.   Fayrene Helper, PA-C 12/10/20 1401    Benjiman Core, MD 12/12/20 2052

## 2020-12-10 NOTE — ED Notes (Signed)
Patient verbalizes understanding of discharge instructions. Opportunity for questioning and answers were provided. Armband removed by staff, pt discharged from ED.  

## 2020-12-10 NOTE — Discharge Instructions (Signed)
You have been evaluated for your chest pain. Fortunately your work-up has been unremarkable today. Cardiology office will call and set up a close follow-up appointment for you. If you do not hear from them, please call office in the next 2 to 3 days.

## 2020-12-17 NOTE — Progress Notes (Signed)
Cardiology Clinic Note   Patient Name: Tim Heath Date of Encounter: 12/18/2020  Primary Care Provider:  Johny Blamer, MD Primary Cardiologist:  Peter Swaziland, MD  Patient Profile    Tim Heath 60 year old male presents the clinic today for follow-up evaluation of his atypical chest pain.  Past Medical History    Past Medical History:  Diagnosis Date  . Coronary artery disease    a. DES-RCA 2003 b. DES-RI 2004/ normal LVF c. cath 04/2016: nonobstructive CAD w/ patent stents in RI and RCA.   Marland Kitchen GERD (gastroesophageal reflux disease)   . History of Bell's palsy   . History of hiatal hernia   . Hyperlipidemia   . Hypertension   . Left ureteral stone    Past Surgical History:  Procedure Laterality Date  . CARDIAC CATHETERIZATION  04-12-2004  &  01-18-2013  dr Sw[place]and   Non-obstructive CAD/  continued patency of stents fo RCA and Intermediate branch/  nomral LVF, ef 55-65%  . CARDIAC CATHETERIZATION N/A 04/25/2016   Procedure: Left Heart Cath and Coronary Angiography;  Surgeon: Peter M Swaziland, MD;  Location: [facility] INVASIVE CV LAB;  Service: Cardiovascular;  Laterality: N/A;  . CARDIOVASCULAR STRESS TEST  last one 06-06-2011   dr [place]and   normal perfusion study/  no ischemia or scar/  normal LV function and wall motion, ef 66%  . CORONARY ANGIOPLASTY WITH STENT PLACEMENT  07-30-2002  dr [place]   abnormal cardiolite (inferior wall ischemia)  single-vessel CAD ,  balloon dilation and DES to RCA/  mild disease LAD 30-40%, non-obstructive/  perserved LV, ef 65%  . CORONARY ANGIOPLASTY WITH STENT PLACEMENT  05-16-2003  dr [place]   DES x1 to Intermediate branch (90%)/  pLAD 40-50%,  mD1  50%,  patent RCA stent/  normal LVF  . CYSTOSCOPY/URETEROSCOPY/HOLMIUM LASER/STENT PLACEMENT Left 04/28/2015   Procedure: CYSTOSCOPY/LEFT RETROGRADE LEFT URETEROSCOPY/STENT PLACEMENT/BLADDER BIOPSY WITH FULGERATION;  Surgeon: Jerilee Field, MD;  Location: [facility];  Service:  Urology;  Laterality: Left;  . LAPAROSCOPIC CHOLECYSTECTOMY  04-01-2003  . TONSILLECTOMY      Allergies  No Known Allergies  History of Present Illness    Tim Heath has a PMH of hypertension, coronary artery disease status post DES (cardiac catheterization 04/25/2016 showed mid RCA 35% stenosis, patent DES x2, mid RCA-distal RCA 25% stenosis, proximal LAD-mid LAD 45% stenosis, ostial second diagonal-second diagonal lesion 60% stenosis, and normal LV function medical management continued.), crescendo angina, angina at rest, type 2 diabetes, hyperlipidemia, and morbid obesity.  He presented to the emergency department 12/10/2020.  He reported intermittent chest discomfort over the prior 2 days.  He describes the pain as a pressure type sensation with some tingling in his left arm.  He also reported some mild nausea and reported some lightheadedness.  He reported the symptoms would last for around 30 minutes with waxing and waning.  He reported that he did take sublingual nitroglycerin and did not have any relief.  His blood pressure was elevated with systolics in the 160s.  He denied fever, cough, abdominal pain, stomach pain, and denied alcohol or tobacco use.  He reported compliance with his medications and denied any active chest pain.  He reported he had been fully vaccinated for COVID-19.  His blood pressure was 124/82 with a pulse of 78.  EKG showed normal sinus rhythm.  Chest x-ray and labs were reassuring.  High-sensitivity troponins were negative.  He was instructed to follow-up with cardiology and discharged in stable  condition.  He presents the clinic today for follow-up evaluation states he has noted increased fatigue and left arm discomfort over the last month.  He feels that his symptoms of gotten worse.  He reports that these are the same symptoms that he had prior to his other stenting's.  He describes the pain as tingling going down his left arm.  He is also noted increased fatigue.  The  pain increases with increased physical activity and subsides with rest.  He reports that he does not do any formal exercise but does walk some at work and has a Health and safety inspector job.  He describes his job is very stressful.  He indicates that he had to leave work yesterday.  We reviewed his labs from the emergency department, previous angiography, previous echocardiogram, and echocardiogram.  Given that these are similar anginal symptoms related to previous prior interventions I will order an echocardiogram, nuclear stress test, and have him follow-up with Dr. Swaziland as scheduled.  Today he denies  shortness of breath, lower extremity edema, palpitations, melena, hematuria, hemoptysis, diaphoresis, weakness, presyncope, syncope, orthopnea, and PND.  Home Medications    Prior to Admission medications   Medication Sig Start Date End Date Taking? Authorizing Provider  aspirin 81 MG tablet Take 81 mg by mouth daily.      [provider]  atorvastatin (LIPITOR) 80 MG tablet Take 1 tablet (80 mg total) by mouth daily. 01/27/20   Swaziland, Peter M, MD  clopidogrel (PLAVIX) 75 MG tablet Take 1 tablet (75 mg total) by mouth daily. 01/27/20   [place]il[place], Peter M, MD  Cyanocobalamin (B-12) 2500 MCG TABS Take 1 tablet by mouth daily.    [provider]  lisinopril (ZESTRIL) 20 MG tablet Take 20 mg by mouth daily.    [provider]  Multiple Vitamin (MULTIVITAMIN) tablet Take 1 tablet by mouth daily.      [provider]  nitroGLYCERIN (NITROSTAT) 0.4 MG SL tablet Place 1 tablet (0.4 mg total) under the tongue every 5 (five) minutes as needed for chest pain. 01/27/20   Sw[place]iland, Peter M, MD    Family History    Family History  Problem Relation Age of Onset  . Liver cancer Mother   . Emphysema Father   . Hypertension Brother   . Coronary artery disease Brother    He indicated that his mother is deceased. He indicated that his father is deceased. He indicated that both of his sisters are  deceased. He indicated that both of his brothers are alive.  Social History    Social History   Socioeconomic History  . Marital status: Married    Spouse name: Not on file  . Number of children: 2  . Years of education: Not on file  . Highest education level: Not on file  Occupational History  . Occupation: DOC WORKER    Employer: [employer]  Tobacco Use  . Smoking status: Former Smoker    Quit date: 03/29/1986    Years since quitting: 34.7  . Smokeless tobacco: Never Used  Substance and Sexual Activity  . Alcohol use: No    Alcohol/week: 0.0 standard drinks  . Drug use: No  . Sexual activity: Not on file  Other Topics Concern  . Not on file  Social History Narrative  . Not on file   Social Determinants of Health   Financial Resource Strain: Not on file  Food Insecurity: Not on file  Transportation Needs: Not on file  Physical Activity:  Not on file  Stress: Not on file  Social Connections: Not on file  Intimate Partner Violence: Not on file     Review of Systems    General:  No chills, fever, night sweats or weight changes.  Cardiovascular:  No chest pain, dyspnea on exertion, edema, orthopnea, palpitations, paroxysmal nocturnal dyspnea. Dermatological: No rash, lesions/masses Respiratory: No cough, dyspnea Urologic: No hematuria, dysuria Abdominal:   No nausea, vomiting, diarrhea, bright red blood per rectum, melena, or hematemesis Neurologic:  No visual changes, wkns, changes in mental status. All other systems reviewed and are otherwise negative except as noted above.  Physical Exam    VS:  BP (!) 146/82 (BP Location: Left Arm, Patient Position: Sitting, Cuff Size: Normal)   Pulse 65   Ht 5\' 8"  (1.727 m)   Wt 249 lb 12.8 oz (113.3 kg)   SpO2 97%   BMI 37.98 kg/m  , BMI Body mass index is 37.98 kg/m. GEN: Well nourished, well developed, in no acute distress. HEENT: normal. Neck: Supple, no JVD, carotid bruits, or masses. Cardiac: RRR, no  murmurs, rubs, or gallops. No clubbing, cyanosis, edema.  Radials/DP/PT 2+ and equal bilaterally.  Respiratory:  Respirations regular and unlabored, clear to auscultation bilaterally. GI: Soft, nontender, nondistended, BS + x 4. MS: no deformity or atrophy. Skin: warm and dry, no rash. Neuro:  Strength and sensation are intact. Psych: Normal affect.  Accessory Clinical Findings    Recent Labs: 12/10/2020: BUN 19; Creatinine, Ser 0.73; Hemoglobin 16.1; Platelets 199; Potassium 4.3; Sodium 141   Recent Lipid Panel    Component Value Date/Time   CHOL 99 01/29/2018 0422   TRIG 46 01/29/2018 0422   HDL 33 (L) 01/29/2018 0422   CHOLHDL 3.0 01/29/2018 0422   VLDL 9 01/29/2018 0422   LDLCALC 57 01/29/2018 0422    ECG personally reviewed by me today-none today.  EKG 12/11/2020 Normal sinus rhythm 78 bpm.    Cardiac catheterization 04/25/2016   Mid RCA lesion, 35% stenosed. The lesion was previously treated with a drug-eluting stent greater than two years ago.  Mid RCA to Dist RCA lesion, 25% stenosed.  Prox LAD to Mid LAD lesion, 45% stenosed.  Ost 2nd Diag to 2nd Diag lesion, 60% stenosed.  The left ventricular systolic function is normal.   1. Nonobstructive CAD 2. Patent stents in the ramus intermediate and RCA 3. Normal LV function  Plan: continue medical therapy. No change in angiogram since 2014. Consider alternative causes for his discomfort. ? Cervical disease with left arm pain and numbness.   Diagnostic Dominance: Right    Intervention    Assessment & Plan   1.  Atypical chest pain-reports tingling pain radiates down his left arm.  His pain increases with increased physical activity.  Presented to the emergency department 12/10/2020 with reports of intermittent periods of chest pressure, nausea, and left arm pain/tingling.  He had tried sublingual nitroglycerin which did not provide relief.  High-sensitivity troponins were negative. Continue aspirin,  atorvastatin, Plavix, lisinopril Heart healthy low-sodium diet-salty 6 given Increase physical activity as tolerated Order echocardiogram Order nuclear stress test  Coronary artery disease-left arm tingling/anginal equivalent.  Cardiac catheterization 04/25/2016 showed nonobstructive CAD and patent previous stenting. Continue aspirin, Plavix, atorvastatin, lisinopril Heart healthy low-sodium diet-salty 6 given Increase physical activity as tolerated Order echocardiogram Order nuclear stress test  Shared Decision Making/Informed Consent The risks [chest pain, shortness of breath, cardiac arrhythmias, dizziness, blood pressure fluctuations, myocardial infarction, stroke/transient ischemic attack, nausea, vomiting, allergic reaction,  radiation exposure, metallic taste sensation and life-threatening complications (estimated to be 1 in 10,000)], benefits (risk stratification, diagnosing coronary artery disease, treatment guidance) and alternatives of a nuclear stress test were discussed in detail with Tim Heath and he agrees to proceed.   Hyperlipidemia-LDL 57 on 01/29/2018. Continue atorvastatin Heart healthy low-sodium high-fiber diet Increase physical activity as tolerated Repeat fasting lipids and LFTs  Disposition: Follow-up with Dr. Swaziland or me as scheduled.   Tim Heath. Airrion Otting NP-C    12/18/2020, 8:36 AM [facility] [address] Office [phone] Fax [phone]  Notice: This dictation was prepared with Dragon dictation along with smaller phrase technology. Any transcriptional errors that result from this process are unintentional and may not be corrected upon review.  I spent 15 minutes examining this patient, reviewing medications, and using patient centered shared decision making involving her cardiac care.  Prior to her visit I spent greater than 20 minutes reviewing her past medical history,  medications, and prior cardiac tests.

## 2020-12-18 ENCOUNTER — Other Ambulatory Visit: Payer: Self-pay

## 2020-12-18 ENCOUNTER — Encounter: Payer: Self-pay | Admitting: Cardiology

## 2020-12-18 ENCOUNTER — Encounter: Payer: Self-pay | Admitting: General Practice

## 2020-12-18 ENCOUNTER — Ambulatory Visit: Payer: Managed Care, Other (non HMO) | Admitting: General Practice

## 2020-12-18 VITALS — BP 146/82 | HR 65 | Ht 68.0 in | Wt 249.8 lb

## 2020-12-18 DIAGNOSIS — I2510 Atherosclerotic heart disease of native coronary artery without angina pectoris: Secondary | ICD-10-CM

## 2020-12-18 DIAGNOSIS — E785 Hyperlipidemia, unspecified: Secondary | ICD-10-CM | POA: Diagnosis not present

## 2020-12-18 DIAGNOSIS — R079 Chest pain, unspecified: Secondary | ICD-10-CM

## 2020-12-18 DIAGNOSIS — Z79899 Other long term (current) drug therapy: Secondary | ICD-10-CM

## 2020-12-18 DIAGNOSIS — R0789 Other chest pain: Secondary | ICD-10-CM | POA: Diagnosis not present

## 2020-12-18 NOTE — Patient Instructions (Signed)
Medication Instructions:  The current medical regimen is effective;  continue present plan and medications as directed. Please refer to the Current Medication list given to you today.  *If you need a refill on your cardiac medications before your next appointment, please call your pharmacy*  Lab Work: FASTING LIPID AND LFT If you have labs (blood work) drawn today and your tests are completely normal, you will receive your results only by:  MyChart Message (if you have MyChart) OR A paper copy in the mail.  If you have any lab test that is abnormal or we need to change your treatment, we will call you to review the results. You may go to any [facility] that is convenient for you however, we do have a lab in our office that is able to assist you. You DO NOT need an appointment for our lab. The lab is open 8:00am and closes at 4:00pm. Lunch 12:45 - 1:45pm.  Testing/Procedures: Echocardiogram - Your physician has requested that you have an echocardiogram. Echocardiography is a painless test that uses sound waves to create images of your heart. It provides your doctor with information about the size and shape of your heart and how well your heart's chambers and valves are working. This procedure takes approximately one hour. There are no restrictions for this procedure. This will be performed at our [facility] location - [address], [address].  Your physician has requested that you have a lexiscan myoview. A cardiac stress test is a cardiological test that measures the heart's ability to respond to external stress in a controlled clinical environment. The stress response is induced by intravenous pharmacological stimulation.  Special Instructions  PLEASE READ AND FOLLOW SALTY 6-ATTACHED-1,800 mg daily  PLEASE INCREASE PHYSICAL ACTIVITY AS TOLERATED  Follow-Up: Your next appointment:  AFTER TESTING  In Person with Peter Swaziland, MD OR IF UNAVAILABLE JESSE CLEAVER, FNP-C   At [facility], you  and your health needs are our priority.  As part of our continuing mission to provide you with exceptional heart care, we have created designated Provider Care Teams.  These Care Teams include your primary Cardiologist (physician) and Advanced Practice Providers (APPs -  Physician Assistants and Nurse Practitioners) who all work together to provide you with the care you need, when you need it.  We recommend signing up for the patient portal called "MyChart".  Sign up information is provided on this After Visit Summary.  MyChart is used to connect with patients for Virtual Visits (Telemedicine).  Patients are able to view lab/test results, encounter notes, upcoming appointments, etc.  Non-urgent messages can be sent to your provider as well.   To learn more about what you can do with MyChart, go to [email or web address].

## 2020-12-19 LAB — HEPATIC FUNCTION PANEL
ALT: 40 IU/L (ref 0–44)
AST: 27 IU/L (ref 0–40)
Albumin: 4.5 g/dL (ref 3.8–4.9)
Alkaline Phosphatase: 93 IU/L (ref 44–121)
Bilirubin Total: 0.8 mg/dL (ref 0.0–1.2)
Bilirubin, Direct: 0.23 mg/dL (ref 0.00–0.40)
Total Protein: 6.6 g/dL (ref 6.0–8.5)

## 2020-12-19 LAB — LIPID PANEL
Chol/HDL Ratio: 2.8 ratio (ref 0.0–5.0)
Cholesterol, Total: 125 mg/dL (ref 100–199)
HDL: 45 mg/dL (ref >39)
LDL Chol Calc (NIH): 66 mg/dL (ref 0–99)
Triglycerides: 64 mg/dL (ref 0–149)
VLDL Cholesterol Cal: 14 mg/dL (ref 5–40)

## 2020-12-23 ENCOUNTER — Telehealth (HOSPITAL_COMMUNITY): Payer: Self-pay | Admitting: *Deleted

## 2020-12-23 NOTE — Telephone Encounter (Signed)
Close encounter 

## 2020-12-24 ENCOUNTER — Other Ambulatory Visit: Payer: Self-pay

## 2020-12-24 ENCOUNTER — Ambulatory Visit (HOSPITAL_COMMUNITY)
Admission: RE | Admit: 2020-12-24 | Discharge: 2020-12-24 | Disposition: A | Discharge status: Home / Self Care | Payer: Managed Care, Other (non HMO) | Source: Ambulatory Visit | Attending: Cardiovascular Disease | Admitting: Cardiovascular Disease

## 2020-12-24 DIAGNOSIS — I2510 Atherosclerotic heart disease of native coronary artery without angina pectoris: Secondary | ICD-10-CM | POA: Diagnosis present

## 2020-12-24 LAB — MYOCARDIAL PERFUSION IMAGING
LV dias vol: 107 mL (ref 62–150)
LV sys vol: 50 mL
Peak HR: 108 {beats}/min
Rest HR: 58 {beats}/min
SDS: 0
SRS: 1
SSS: 1
TID: 0.98

## 2020-12-24 MED ORDER — AMINOPHYLLINE 25 MG/ML IV SOLN
75.0000 mg | Freq: Once | INTRAVENOUS | Status: AC
  Administered 2020-12-24: 75 mg via INTRAVENOUS

## 2020-12-24 MED ORDER — TECHNETIUM TC 99M TETROFOSMIN IV KIT
9.9000 | PACK | Freq: Once | INTRAVENOUS | Status: AC | PRN
  Administered 2020-12-24: 9.9 via INTRAVENOUS
  Filled 2020-12-24: qty 10

## 2020-12-24 MED ORDER — REGADENOSON 0.4 MG/5ML IV SOLN
0.4000 mg | Freq: Once | INTRAVENOUS | Status: AC
  Administered 2020-12-24: 0.4 mg via INTRAVENOUS

## 2020-12-24 MED ORDER — TECHNETIUM TC 99M TETROFOSMIN IV KIT
30.0000 | PACK | Freq: Once | INTRAVENOUS | Status: AC | PRN
  Administered 2020-12-24: 30 via INTRAVENOUS
  Filled 2020-12-24: qty 30

## 2021-01-04 NOTE — Progress Notes (Signed)
[name] Date of Birth: 26-Apr-1961   History of Present Illness: Tim Heath is seen today for followup of CAD. He has a history of coronary disease with stenting of the right coronary in 2003 with a Cypher stent. He had stenting of the ramus intermediate branch in 2004 with a Taxus stent. Cardiac catheterization in March 2014 showed nonobstructive disease. In July of 2017 he presented with chest pain and ruled out for MI. A Myoview study was felt to be intermediate risk. He underwent cardiac cath which showed nonobstructive CAD and patent stents in the RCA and ramus intermediate vessels.   He was admitted 4/8-01/30/18 with chest pain. He ruled out for MI. Echo was unremarkable. Myoview was low risk. Beta blocker was discontinued due to bradycardia. 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 When last seen he had lost 125 lbs. Was able to come off diabetic medication. We reduced his lisinopril by half.  He presented to the emergency department 12/10/2020.  He reported intermittent chest discomfort over the prior 2 days.  He describes the pain as a pressure type sensation with some tingling in his left arm.  He also reported some mild nausea and reported some lightheadedness.  He reported the symptoms would last for around 30 minutes with waxing and waning. He was seen in our office by Edd Fabian NP and Echo and Myoview studies were ordered.  Myoview was normal. Echo is pending.  He states he is doing better. He is having less indigestion. Trying to eat better. Is planning to get a dog.    Current Outpatient Medications on File Prior to Visit  Medication Sig Dispense Refill   aspirin 81 MG tablet Take 81 mg by mouth daily.     atorvastatin (LIPITOR) 80 MG tablet Take 1 tablet (80 mg total) by mouth daily. 90 tablet 3   clopidogrel (PLAVIX) 75 MG tablet Take 1 tablet (75 mg total) by mouth daily. 90 tablet 3   Cyanocobalamin (B-12) 2500 MCG TABS Take 1 tablet by  mouth daily.     lisinopril (ZESTRIL) 20 MG tablet Take 20 mg by mouth daily.     Multiple Vitamin (MULTIVITAMIN) tablet Take 1 tablet by mouth daily.     nitroGLYCERIN (NITROSTAT) 0.4 MG SL tablet Place 1 tablet (0.4 mg total) under the tongue every 5 (five) minutes as needed for chest pain. 25 tablet 11   No current facility-administered medications on file prior to visit.    No Known Allergies  Past Medical History:  Diagnosis Date   Coronary artery disease    a. DES-RCA 2003 b. DES-RI 2004/ normal LVF c. cath 04/2016: nonobstructive CAD w/ patent stents in RI and RCA.    GERD (gastroesophageal reflux disease)    History of Bell's palsy    History of hiatal hernia    Hyperlipidemia    Hypertension    Left ureteral stone     Past Surgical History:  Procedure Laterality Date   CARDIAC CATHETERIZATION  04-12-2004  &  01-18-2013  dr Swaz[place]and   Non-obstructive CAD/  continued patency of stents fo RCA and Intermediate branch/  nomral LVF, ef 55-65%   CARDIAC CATHETERIZATION N/A 04/25/2016   Procedure: Left Heart Cath and Coronary Angiography;  Surgeon: Adeeb Konecny M Swaz[place]and, MD;  Location: [facility] INVASIVE CV LAB;  Service: Cardiovascular;  Laterality: N/A;   CARDIOVASCULAR STRESS TEST  last one 06-06-2011   dr [place]and   normal perfusion study/  no ischemia or scar/  normal LV function and wall motion, ef 66%   CORONARY ANGIOPLASTY WITH STENT PLACEMENT  07-30-2002  dr [place]   abnormal cardiolite (inferior wall ischemia)  single-vessel CAD ,  balloon dilation and DES to RCA/  mild disease LAD 30-40%, non-obstructive/  perserved LV, ef 65%   CORONARY ANGIOPLASTY WITH STENT PLACEMENT  05-16-2003  dr [place]   DES x1 to Intermediate branch (90%)/  pLAD 40-50%,  mD1  50%,  patent RCA stent/  normal LVF   CYSTOSCOPY/URETEROSCOPY/HOLMIUM LASER/STENT PLACEMENT Left 04/28/2015   Procedure: CYSTOSCOPY/LEFT RETROGRADE LEFT URETEROSCOPY/STENT PLACEMENT/BLADDER BIOPSY WITH FULGERATION;  Surgeon:  Jerilee Field, MD;  Location: [facility];  Service: Urology;  Laterality: Left;   LAPAROSCOPIC CHOLECYSTECTOMY  04-01-2003   TONSILLECTOMY      Social History   Tobacco Use  Smoking Status Former Smoker   Quit date: 03/29/1986   Years since quitting: 34.8  Smokeless Tobacco Never Used    Social History   Substance and Sexual Activity  Alcohol Use No   Alcohol/week: 0.0 standard drinks    Family History  Problem Relation Age of Onset   Liver cancer Mother    Emphysema Father    Hypertension Brother    Coronary artery disease Brother     Review of Systems: As noted in history of present illness. All other systems were reviewed and are negative.  Physical Exam: BP 136/88    Pulse 76    Ht 5\' 8"  (1.727 m)    Wt 247 lb 12.8 oz (112.4 kg)    SpO2 95%    BMI 37.68 kg/m  GENERAL:  Well appearing obese WM in NAD HEENT:  PERRL, EOMI, sclera are clear. Oropharynx is clear. NECK:  No jugular venous distention, carotid upstroke brisk and symmetric, no bruits, no thyromegaly or adenopathy LUNGS:  Clear to auscultation bilaterally CHEST:  Unremarkable HEART:  RRR,  PMI not displaced or sustained,S1 and S2 within normal limits, no S3, no S4: no clicks, no rubs, no murmurs ABD:  Soft, nontender. BS +, no masses or bruits. No hepatomegaly, no splenomegaly EXT:  2 + pulses throughout, no edema, no cyanosis no clubbing SKIN:  Warm and dry.  No rashes NEURO:  Alert and oriented x 3. Cranial nerves II through XII intact. PSYCH:  Cognitively intact     LABORATORY DATA:   Lab Results  Component Value Date   WBC 6.6 12/10/2020   HGB 16.1 12/10/2020   HCT 47.1 12/10/2020   PLT 199 12/10/2020   GLUCOSE 108 (H) 12/10/2020   CHOL 125 12/18/2020   TRIG 64 12/18/2020   HDL 45 12/18/2020   LDLCALC 66 12/18/2020   ALT 40 12/18/2020   AST 27 12/18/2020   NA 141 12/10/2020   K 4.3 12/10/2020   CL 106 12/10/2020   CREATININE 0.73 12/10/2020   BUN 19  12/10/2020   CO2 26 12/10/2020   TSH 1.066 04/23/2016   INR 0.95 04/23/2016   HGBA1C 5.5 01/29/2018   Labs reviewed from primary care: A1c 6.5%. Cholesterol 137, triglycerides 52, HDL 34, LDL 92. Glucose 124.  Dated 07/10/17: cholesterol 113, triglycerides 68, HDL 31, LDL 68. A1c 6.1%. CBC and CMET normal.  Dated 04/23/18: normal chemistries and TSH.  Dated 10/29/18: A1c 5.4%.   Echo 01/29/18: Study Conclusions  - Left ventricle: The cavity size was normal. Systolic function was   normal. The estimated ejection fraction was in the range of 60%   to 65%. Wall motion was normal; there were  no regional wall   motion abnormalities. Left ventricular diastolic function   parameters were normal.   Myoview 01/29/18: T wave inversion was noted during stress in the III, aVF, V3, V4, V5 and V6 leads, beginning at 1 minutes of stress, ending at 3 minutes of stress, and returning to baseline after 1-5 mins of recovery.  Defect 1: There is a small defect of mild severity present in the apical anterior and apex location.  Findings consistent with very mild ischemia in the LAD territory.  This is a low risk study. The left ventricular ejection fraction is normal (55-65%).  Myoview 12/24/20: Study Highlights   Nuclear stress EF: 53%. Visually, the LV EF appears to be greater than 53% and appears normal .  There was no ST segment deviation noted during stress.  This is a low risk study. There is no evidence of ischemia or previous infarction.  The study is normal.    Assessment / Plan: 1. Coronary disease status post stenting procedures as noted above.  Cardiac catheterization in July 2017 showed nonobstructive disease.  Myoview on 12/24/20 was normal.   He is doing better. I wonder if a lot of his symptoms weren't more related to indigestion. Will check Echo. Weight loss is going to be key.  We have opted to continue DAPT since he has first generation DES.   2. Morbid obesity.  Continue current  lifestyle changes.   3. Hyperlipidemia. Last  Lipids were at goal  4. Hypertension-BP is well controlled.    5. DM type 2. Off medication now with weight loss.   I will follow up in 6 months.

## 2021-01-08 ENCOUNTER — Ambulatory Visit: Payer: Managed Care, Other (non HMO) | Admitting: Cardiology

## 2021-01-08 ENCOUNTER — Encounter: Payer: Self-pay | Admitting: Cardiology

## 2021-01-08 ENCOUNTER — Other Ambulatory Visit: Payer: Self-pay

## 2021-01-08 VITALS — BP 136/88 | HR 76 | Ht 68.0 in | Wt 247.8 lb

## 2021-01-08 DIAGNOSIS — I2510 Atherosclerotic heart disease of native coronary artery without angina pectoris: Secondary | ICD-10-CM

## 2021-01-08 DIAGNOSIS — E785 Hyperlipidemia, unspecified: Secondary | ICD-10-CM | POA: Diagnosis not present

## 2021-01-08 DIAGNOSIS — I10 Essential (primary) hypertension: Secondary | ICD-10-CM

## 2021-01-08 DIAGNOSIS — R0789 Other chest pain: Secondary | ICD-10-CM

## 2021-01-08 MED ORDER — LISINOPRIL 20 MG PO TABS: 20.0000 mg | ORAL_TABLET | Freq: Every day | ORAL | 3 refills | Status: DC

## 2021-01-08 MED ORDER — ATORVASTATIN CALCIUM 80 MG PO TABS: 80.0000 mg | ORAL_TABLET | Freq: Every day | ORAL | 3 refills | Status: DC

## 2021-01-08 MED ORDER — NITROGLYCERIN 0.4 MG SL SUBL: 0.4000 mg | SUBLINGUAL_TABLET | SUBLINGUAL | 11 refills | Status: DC | PRN

## 2021-01-08 MED ORDER — CLOPIDOGREL BISULFATE 75 MG PO TABS: 75.0000 mg | ORAL_TABLET | Freq: Every day | ORAL | 3 refills | Status: DC

## 2021-01-12 ENCOUNTER — Other Ambulatory Visit: Payer: Self-pay

## 2021-01-12 ENCOUNTER — Ambulatory Visit (HOSPITAL_COMMUNITY): Payer: Managed Care, Other (non HMO) | Attending: Cardiovascular Disease

## 2021-01-12 DIAGNOSIS — I2510 Atherosclerotic heart disease of native coronary artery without angina pectoris: Secondary | ICD-10-CM | POA: Diagnosis present

## 2021-01-12 LAB — ECHOCARDIOGRAM COMPLETE
Area-P 1/2: 3.39 cm2
S' Lateral: 3 cm

## 2021-01-25 ENCOUNTER — Ambulatory Visit: Payer: Managed Care, Other (non HMO) | Admitting: Cardiology

## 2021-11-15 ENCOUNTER — Other Ambulatory Visit: Payer: Self-pay | Admitting: Cardiology

## 2022-02-21 ENCOUNTER — Other Ambulatory Visit: Payer: Self-pay | Admitting: Cardiology

## 2022-03-11 ENCOUNTER — Telehealth: Payer: Self-pay | Admitting: Cardiology

## 2022-03-11 DIAGNOSIS — E785 Hyperlipidemia, unspecified: Secondary | ICD-10-CM

## 2022-03-11 DIAGNOSIS — I10 Essential (primary) hypertension: Secondary | ICD-10-CM

## 2022-03-11 DIAGNOSIS — I2510 Atherosclerotic heart disease of native coronary artery without angina pectoris: Secondary | ICD-10-CM

## 2022-03-11 NOTE — Telephone Encounter (Signed)
Patient wanted to know what labs he needs to have done prior to his appointment 04/05/22. He would like to speak to [facility]

## 2022-03-11 NOTE — Telephone Encounter (Signed)
Spoke to patient advised he will need fasting lipid, hepatic panels and bmet done 1 week before 6/13 appointment with Dr.Jordan.Orders placed.

## 2022-03-22 ENCOUNTER — Telehealth: Payer: Self-pay | Admitting: Cardiology

## 2022-03-22 NOTE — Telephone Encounter (Signed)
*  STAT* If patient is at the pharmacy, call can be transferred to refill team.   1. Which medications need to be refilled? (please list name of each medication and dose if known)  clopidogrel (PLAVIX) 75 MG tablet  2. Which pharmacy/location (including street and city if local pharmacy) is medication to be sent to? [facility] - [place] ([place]), [place] - [address]  3. Do they need a 30 day or 90 day supply? 30   Patient is scheduled to see Dr. Swaziland 04/05/22 but only has two pills left

## 2022-03-23 MED ORDER — CLOPIDOGREL BISULFATE 75 MG PO TABS: 75.0000 mg | ORAL_TABLET | Freq: Every day | ORAL | 0 refills | Status: DC

## 2022-03-23 NOTE — Telephone Encounter (Signed)
A two week supply has been sent to pharmacy until upcoming appointment on 04-05-2022.

## 2022-03-28 LAB — HEPATIC FUNCTION PANEL
ALT: 45 IU/L — ABNORMAL HIGH (ref 0–44)
AST: 31 IU/L (ref 0–40)
Albumin: 4.2 g/dL (ref 3.8–4.8)
Alkaline Phosphatase: 81 IU/L (ref 44–121)
Bilirubin Total: 1 mg/dL (ref 0.0–1.2)
Bilirubin, Direct: 0.25 mg/dL (ref 0.00–0.40)
Total Protein: 6.2 g/dL (ref 6.0–8.5)

## 2022-03-28 LAB — LIPID PANEL
Chol/HDL Ratio: 2.7 ratio (ref 0.0–5.0)
Cholesterol, Total: 105 mg/dL (ref 100–199)
HDL: 39 mg/dL — ABNORMAL LOW (ref >39)
LDL Chol Calc (NIH): 50 mg/dL (ref 0–99)
Triglycerides: 81 mg/dL (ref 0–149)
VLDL Cholesterol Cal: 16 mg/dL (ref 5–40)

## 2022-03-28 LAB — BASIC METABOLIC PANEL
BUN/Creatinine Ratio: 26 — ABNORMAL HIGH (ref 10–24)
BUN: 18 mg/dL (ref 8–27)
CO2: 27 mmol/L (ref 20–29)
Calcium: 9.1 mg/dL (ref 8.6–10.2)
Chloride: 103 mmol/L (ref 96–106)
Creatinine, Ser: 0.69 mg/dL — ABNORMAL LOW (ref 0.76–1.27)
Glucose: 96 mg/dL (ref 70–99)
Potassium: 4.7 mmol/L (ref 3.5–5.2)
Sodium: 140 mmol/L (ref 134–144)
eGFR: 105 mL/min/{1.73_m2} (ref >59)

## 2022-03-30 ENCOUNTER — Encounter: Payer: Self-pay | Admitting: *Deleted

## 2022-04-01 NOTE — Progress Notes (Unsigned)
Tim Heath Date of Birth: Apr 06, 1961   History of Present Illness: Tim Heath is seen today for followup of CAD. He has a history of coronary disease with stenting of the right coronary in 2003 with a Cypher stent. He had stenting of the ramus intermediate branch in 2004 with a Taxus stent. Cardiac catheterization in March 2014 showed nonobstructive disease. In July of 2017 he presented with chest pain and ruled out for MI. A Myoview study was felt to be intermediate risk. He underwent cardiac cath which showed nonobstructive CAD and patent stents in the RCA and ramus intermediate vessels.   He was admitted 4/8-01/30/18 with chest pain. He ruled out for MI. Echo was unremarkable. Myoview was low risk. Beta blocker was discontinued due to bradycardia. 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 When last seen he had lost 125 lbs. Was able to come off diabetic medication. We reduced his lisinopril by half.  He presented to the emergency department 12/10/2020.  He reported intermittent chest discomfort over the prior 2 days.  He describes the pain as a pressure type sensation with some tingling in his left arm.  He also reported some mild nausea and reported some lightheadedness.  He reported the symptoms would last for around 30 minutes with waxing and waning. He was seen in our office by Edd FabianJesse Cleaver NP and Echo and Myoview studies were ordered.  Myoview was normal. Echo was also normal.   He states he is doing better. He still has occasional indigestion. Uses TUMS. Trying to eat well.   Current Outpatient Medications on File Prior to Visit  Medication Sig Dispense Refill   aspirin 81 MG tablet Take 81 mg by mouth daily.     atorvastatin (LIPITOR) 80 MG tablet Take 1 tablet by mouth once daily 30 tablet 0   calcium carbonate (TUMS - DOSED IN MG ELEMENTAL CALCIUM) 500 MG chewable tablet Chew 1 tablet by mouth as needed for indigestion or heartburn.     clopidogrel (PLAVIX)  75 MG tablet Take 1 tablet (75 mg total) by mouth daily. KEEP OV. 15 tablet 0   Cyanocobalamin (B-12) 2500 MCG TABS Take 1 tablet by mouth daily.     lisinopril (ZESTRIL) 20 MG tablet TAKE 1 TABLET BY MOUTH ONCE DAILY . APPOINTMENT REQUIRED FOR FUTURE REFILLS 30 tablet 0   Multiple Vitamin (MULTIVITAMIN) tablet Take 1 tablet by mouth daily.     nitroGLYCERIN (NITROSTAT) 0.4 MG SL tablet PLACE 1 TABLET UNDER THE TONGUE EVERY 5 MINUTES AS NEEDED FOR CHEST PAIN 25 tablet 0   No current facility-administered medications on file prior to visit.    No Known Allergies  Past Medical History:  Diagnosis Date   Coronary artery disease    a. DES-RCA 2003 b. DES-RI 2004/ normal LVF c. cath 04/2016: nonobstructive CAD w/ patent stents in RI and RCA.    GERD (gastroesophageal reflux disease)    History of Bell's palsy    History of hiatal hernia    Hyperlipidemia    Hypertension    Left ureteral stone     Past Surgical History:  Procedure Laterality Date   CARDIAC CATHETERIZATION  04-12-2004  &  01-18-2013  dr Swazilandjordan   Non-obstructive CAD/  continued patency of stents fo RCA and Intermediate branch/  nomral LVF, ef 55-65%   CARDIAC CATHETERIZATION N/A 04/25/2016   Procedure: Left Heart Cath and Coronary Angiography;  Surgeon: Tristen Pennino M SwazilandJordan, MD;  [facility];  Service: Cardiovascular;  Laterality: N/A;   CARDIOVASCULAR STRESS TEST  last one 06-06-2011   dr [place]   normal perfusion study/  no ischemia or scar/  normal LV function and wall motion, ef 66%   CORONARY ANGIOPLASTY WITH STENT PLACEMENT  07-30-2002  dr [place]   abnormal cardiolite (inferior wall ischemia)  single-vessel CAD ,  balloon dilation and DES to RCA/  mild disease LAD 30-40%, non-obstructive/  perserved LV, ef 65%   CORONARY ANGIOPLASTY WITH STENT PLACEMENT  05-16-2003  dr [place]   DES x1 to Intermediate branch (90%)/  pLAD 40-50%,  mD1  50%,  patent RCA stent/  normal LVF   CYSTOSCOPY/URETEROSCOPY/HOLMIUM  LASER/STENT PLACEMENT Left 04/28/2015   Procedure: CYSTOSCOPY/LEFT RETROGRADE LEFT URETEROSCOPY/STENT PLACEMENT/BLADDER BIOPSY WITH FULGERATION;  Surgeon: Jerilee Field, MD;  Location: [facility];  Service: Urology;  Laterality: Left;   LAPAROSCOPIC CHOLECYSTECTOMY  04-01-2003   TONSILLECTOMY      Social History   Tobacco Use  Smoking Status Former   Types: Cigarettes   Quit date: 03/29/1986   Years since quitting: 36.0  Smokeless Tobacco Never    Social History   Substance and Sexual Activity  Alcohol Use No   Alcohol/week: 0.0 standard drinks of alcohol    Family History  Problem Relation Age of Onset   Liver cancer Mother    Emphysema Father    Hypertension Brother    Coronary artery disease Brother     Review of Systems: As noted in history of present illness. All other systems were reviewed and are negative.  Physical Exam: BP 120/70 (BP Location: Left Arm)   Pulse 62   Ht 5\' 8"  (1.727 m)   Wt 250 lb 12.8 oz (113.8 kg)   SpO2 97%   BMI 38.13 kg/m  GENERAL:  Well appearing obese WM in NAD HEENT:  PERRL, EOMI, sclera are clear. Oropharynx is clear. NECK:  No jugular venous distention, carotid upstroke brisk and symmetric, no bruits, no thyromegaly or adenopathy LUNGS:  Clear to auscultation bilaterally CHEST:  Unremarkable HEART:  RRR,  PMI not displaced or sustained,S1 and S2 within normal limits, no S3, no S4: no clicks, no rubs, no murmurs ABD:  Soft, nontender. BS +, no masses or bruits. No hepatomegaly, no splenomegaly EXT:  2 + pulses throughout, no edema, no cyanosis no clubbing SKIN:  Warm and dry.  No rashes NEURO:  Alert and oriented x 3. Cranial nerves II through XII intact. PSYCH:  Cognitively intact     LABORATORY DATA:   Lab Results  Component Value Date   WBC 6.6 12/10/2020   HGB 16.1 12/10/2020   HCT 47.1 12/10/2020   PLT 199 12/10/2020   GLUCOSE 96 03/28/2022   CHOL 105 03/28/2022   TRIG 81 03/28/2022   HDL 39 (L)  03/28/2022   LDLCALC 50 03/28/2022   ALT 45 (H) 03/28/2022   AST 31 03/28/2022   NA 140 03/28/2022   K 4.7 03/28/2022   CL 103 03/28/2022   CREATININE 0.69 (L) 03/28/2022   BUN 18 03/28/2022   CO2 27 03/28/2022   TSH 1.066 04/23/2016   INR 0.95 04/23/2016   HGBA1C 5.5 01/29/2018   Labs reviewed from primary care: A1c 6.5%. Cholesterol 137, triglycerides 52, HDL 34, LDL 92. Glucose 124.  Dated 07/10/17: cholesterol 113, triglycerides 68, HDL 31, LDL 68. A1c 6.1%. CBC and CMET normal.  Dated 04/23/18: normal chemistries and TSH.  Dated 10/29/18: A1c 5.4%.  Echo 01/29/18: Study Conclusions   -  Left ventricle: The cavity size was normal. Systolic function was   normal. The estimated ejection fraction was in the range of 60%   to 65%. Wall motion was normal; there were no regional wall   motion abnormalities. Left ventricular diastolic function   parameters were normal.  Myoview 01/29/18: T wave inversion was noted during stress in the III, aVF, V3, V4, V5 and V6 leads, beginning at 1 minutes of stress, ending at 3 minutes of stress, and returning to baseline after 1-5 mins of recovery. Defect 1: There is a small defect of mild severity present in the apical anterior and apex location. Findings consistent with very mild ischemia in the LAD territory. This is a low risk study. The left ventricular ejection fraction is normal (55-65%).  Myoview 12/24/20: Study Highlights  Nuclear stress EF: 53%. Visually, the LV EF appears to be greater than 53% and appears normal . There was no ST segment deviation noted during stress. This is a low risk study. There is no evidence of ischemia or previous infarction. The study is normal.  Echo 01/12/21: IMPRESSIONS     1. Left ventricular ejection fraction, by estimation, is 60 to 65%. The  left ventricle has normal function. The left ventricle has no regional  wall motion abnormalities. Left ventricular diastolic parameters were  normal.   2. Right  ventricular systolic function is normal. The right ventricular  size is normal. There is normal pulmonary artery systolic pressure.   3. The mitral valve is grossly normal. No evidence of mitral valve  regurgitation.   4. The aortic valve is normal in structure. Aortic valve regurgitation is  not visualized.   Assessment / Plan: 1. Coronary disease status post stenting procedures as noted above.  Cardiac catheterization in July 2017 showed nonobstructive disease.  Myoview on 12/24/20 was normal.  Echo was normal. Continued DAPT since he has first generation DES.   2. Morbid obesity.  Continue current lifestyle changes.   3. Hyperlipidemia. Last  Lipids were at goal. LDL 50  4. Hypertension-BP is well controlled.    5. DM type 2. Off medication now with weight loss.   I will follow up in one year

## 2022-04-05 ENCOUNTER — Ambulatory Visit: Payer: Managed Care, Other (non HMO) | Admitting: Cardiology

## 2022-04-05 ENCOUNTER — Encounter: Payer: Self-pay | Admitting: Cardiology

## 2022-04-05 VITALS — BP 120/70 | HR 62 | Ht 68.0 in | Wt 250.8 lb

## 2022-04-05 DIAGNOSIS — I2510 Atherosclerotic heart disease of native coronary artery without angina pectoris: Secondary | ICD-10-CM

## 2022-04-05 DIAGNOSIS — E785 Hyperlipidemia, unspecified: Secondary | ICD-10-CM | POA: Diagnosis not present

## 2022-04-05 DIAGNOSIS — I10 Essential (primary) hypertension: Secondary | ICD-10-CM | POA: Diagnosis not present

## 2022-04-05 NOTE — Patient Instructions (Signed)
  Follow-Up: At [facility], you and your health needs are our priority.  As part of our continuing mission to provide you with exceptional heart care, we have created designated Provider Care Teams.  These Care Teams include your primary Cardiologist (physician) and Advanced Practice Providers (APPs -  Physician Assistants and Nurse Practitioners) who all work together to provide you with the care you need, when you need it.  We recommend signing up for the patient portal called "MyChart".  Sign up information is provided on this After Visit Summary.  MyChart is used to connect with patients for Virtual Visits (Telemedicine).  Patients are able to view lab/test results, encounter notes, upcoming appointments, etc.  Non-urgent messages can be sent to your provider as well.   To learn more about what you can do with MyChart, go to [email or web address].    Your next appointment:   12 month(s)  The format for your next appointment:   In Person  Provider:   Peter Jordan, MD      Important Information About Sugar       

## 2022-04-07 ENCOUNTER — Other Ambulatory Visit: Payer: Self-pay | Admitting: Cardiology

## 2023-03-21 ENCOUNTER — Other Ambulatory Visit: Payer: Self-pay | Admitting: Cardiology

## 2023-06-12 ENCOUNTER — Telehealth: Payer: Self-pay

## 2023-06-12 NOTE — Telephone Encounter (Signed)
1st attempt at contacting pt. Pt has an in-office appt the same day as colonoscopy. I found an earlier cardiology appt. [facility]trc

## 2023-06-12 NOTE — Telephone Encounter (Signed)
   Pre-operative Risk Assessment    Patient Name: Tim Heath  DOB: 1961/02/28 MRN: [id number]    Pts last OV was 04/05/22 with Dr. Swaziland. Pts next appt is 07/31/23 with Dr. Swaziland  Request for Surgical Clearance    Procedure:   Colonoscopy  Date of Surgery:  Clearance 07/31/23                                 Surgeon:  Dr. Waynetta Pean Surgeon's Group or Practice Name:  [facility] Phone number:  [phone] Fax number:  [phone]   Type of Clearance Requested:   - Medical  - Pharmacy:  Hold Aspirin and Clopidogrel (Plavix) -pt will need instructions on when/if to hold   Type of Anesthesia:   propofol   Additional requests/questions:    Signed, Zada Finders   06/12/2023, 2:02 PM

## 2023-06-12 NOTE — Telephone Encounter (Signed)
    Primary Cardiologist:Peter Swaziland, MD  Chart reviewed as part of pre-operative protocol coverage. Because of Tim Heath's past medical history and time since last visit, he/she will require a follow-up visit in order to better assess preoperative cardiovascular risk.  Pre-op covering staff: - Please schedule Office  appointment and call patient to inform them. - Please contact requesting surgeon's office via preferred method (i.e, phone, fax) to inform them of need for appointment prior to surgery.  If applicable, this message will also be routed to pharmacy pool and/or primary cardiologist for input on holding anticoagulant/antiplatelet agent as requested below so that this information is available at time of patient's appointment.   Ronney Asters, NP  06/12/2023, 2:26 PM

## 2023-06-12 NOTE — Telephone Encounter (Signed)
Pt called and said that he rescheduled his colonoscopy for 08/07/23. I will update appt notes and send to Dr. Swaziland.

## 2023-07-26 NOTE — Progress Notes (Signed)
Tim Heath Date of Birth: 06-27-1961   History of Present Illness: Tim Heath is seen today for followup of CAD. He has a history of coronary disease with stenting of the right coronary in 2003 with a Cypher stent. He had stenting of the ramus intermediate branch in 2004 with a Taxus stent. Cardiac catheterization in March 2014 showed nonobstructive disease. In July of 2017 he presented with chest pain and ruled out for MI. A Myoview study was felt to be intermediate risk. He underwent cardiac cath which showed nonobstructive CAD and patent stents in the RCA and ramus intermediate vessels.   He was admitted 4/8-01/30/18 with chest pain. He ruled out for MI. Echo was unremarkable. Myoview was low risk. Beta blocker was discontinued due to bradycardia.   He presented to the emergency department 12/10/2020.  He reported intermittent chest discomfort over the prior 2 days.    Myoview was normal. Echo was also normal.   He states he is doing well. Denies any chest pain. Trying to eat well. Still working and is active. Is having colonoscopy next week.    Current Outpatient Medications on File Prior to Visit  Medication Sig Dispense Refill   Cyanocobalamin (B-12) 2500 MCG TABS Take 1 tablet by mouth daily.     aspirin 81 MG tablet Take 81 mg by mouth daily. (Patient not taking: Reported on 07/31/2023)     atorvastatin (LIPITOR) 80 MG tablet Take 1 tablet by mouth once daily (Patient not taking: Reported on 07/31/2023) 90 tablet 1   calcium carbonate (TUMS - DOSED IN MG ELEMENTAL CALCIUM) 500 MG chewable tablet Chew 1 tablet by mouth as needed for indigestion or heartburn. (Patient not taking: Reported on 07/31/2023)     clopidogrel (PLAVIX) 75 MG tablet Take 1 tablet by mouth once daily (Patient not taking: Reported on 07/31/2023) 90 tablet 1   lisinopril (ZESTRIL) 20 MG tablet Take 1 tablet by mouth once daily (Patient not taking: Reported on 07/31/2023) 90 tablet 1   Multiple Vitamin (MULTIVITAMIN) tablet  Take 1 tablet by mouth daily. (Patient not taking: Reported on 07/31/2023)     nitroGLYCERIN (NITROSTAT) 0.4 MG SL tablet PLACE 1 TABLET UNDER THE TONGUE EVERY 5 MINUTES AS NEEDED FOR CHEST PAIN (Patient not taking: Reported on 07/31/2023) 25 tablet 0   No current facility-administered medications on file prior to visit.    No Known Allergies  Past Medical History:  Diagnosis Date   Coronary artery disease    a. DES-RCA 2003 b. DES-RI 2004/ normal LVF c. cath 04/2016: nonobstructive CAD w/ patent stents in RI and RCA.    GERD (gastroesophageal reflux disease)    History of Bell's palsy    History of hiatal hernia    Hyperlipidemia    Hypertension    Left ureteral stone     Past Surgical History:  Procedure Laterality Date   CARDIAC CATHETERIZATION  04-12-2004  &  01-18-2013  dr [place]   Non-obstructive CAD/  continued patency of stents fo RCA and Intermediate branch/  nomral LVF, ef 55-65%   CARDIAC CATHETERIZATION N/A 04/25/2016   Procedure: Left Heart Cath and Coronary Angiography;  Surgeon: Preesha Benjamin M [place], MD;  Location: [facility] INVASIVE CV LAB;  Service: Cardiovascular;  Laterality: N/A;   CARDIOVASCULAR STRESS TEST  last one 06-06-2011   dr [place]   normal perfusion study/  no ischemia or scar/  normal LV function and wall motion, ef 66%   CORONARY ANGIOPLASTY WITH STENT PLACEMENT  07-30-2002  dr  [place]   abnormal cardiolite (inferior wall ischemia)  single-vessel CAD ,  balloon dilation and DES to RCA/  mild disease LAD 30-40%, non-obstructive/  perserved LV, ef 65%   CORONARY ANGIOPLASTY WITH STENT PLACEMENT  05-16-2003  dr [place]   DES x1 to Intermediate branch (90%)/  pLAD 40-50%,  mD1  50%,  patent RCA stent/  normal LVF   CYSTOSCOPY/URETEROSCOPY/HOLMIUM LASER/STENT PLACEMENT Left 04/28/2015   Procedure: CYSTOSCOPY/LEFT RETROGRADE LEFT URETEROSCOPY/STENT PLACEMENT/BLADDER BIOPSY WITH FULGERATION;  Surgeon: Jerilee Field, MD;  Location: [facility];  Service: Urology;   Laterality: Left;   LAPAROSCOPIC CHOLECYSTECTOMY  04-01-2003   TONSILLECTOMY      Social History   Tobacco Use  Smoking Status Former   Current packs/day: 0.00   Types: Cigarettes   Quit date: 03/29/1986   Years since quitting: 37.3  Smokeless Tobacco Never    Social History   Substance and Sexual Activity  Alcohol Use No   Alcohol/week: 0.0 standard drinks of alcohol    Family History  Problem Relation Age of Onset   Liver cancer Mother    Emphysema Father    Hypertension Brother    Coronary artery disease Brother     Review of Systems: As noted in history of present illness. All other systems were reviewed and are negative.  Physical Exam: BP 128/82 (BP Location: Left Arm, Patient Position: Sitting, Cuff Size: Normal)   Pulse 68   Ht 5\' 8"  (1.727 m)   Wt 261 lb 12.8 oz (118.8 kg)   SpO2 98%   BMI 39.81 kg/m  GENERAL:  Well appearing obese WM in NAD HEENT:  PERRL, EOMI, sclera are clear. Oropharynx is clear. NECK:  No jugular venous distention, carotid upstroke brisk and symmetric, no bruits, no thyromegaly or adenopathy LUNGS:  Clear to auscultation bilaterally CHEST:  Unremarkable HEART:  RRR,  PMI not displaced or sustained,S1 and S2 within normal limits, no S3, no S4: no clicks, no rubs, no murmurs ABD:  Soft, nontender. BS +, no masses or bruits. No hepatomegaly, no splenomegaly EXT:  2 + pulses throughout, no edema, no cyanosis no clubbing SKIN:  Warm and dry.  No rashes NEURO:  Alert and oriented x 3. Cranial nerves II through XII intact. PSYCH:  Cognitively intact     LABORATORY DATA:  EKG Interpretation Date/Time:  Monday July 31 2023 08:15:55 EDT Ventricular Rate:  55 PR Interval:  162 QRS Duration:  92 QT Interval:  412 QTC Calculation: 394 R Axis:   53  Text Interpretation: Sinus bradycardia When compared with ECG of 10-Dec-2020 08:05, No significant change was found Confirmed by [place], [name] [phone]) on 07/31/2023 8:17:44 AM      Lab Results  Component Value Date   WBC 6.6 12/10/2020   HGB 16.1 12/10/2020   HCT 47.1 12/10/2020   PLT 199 12/10/2020   GLUCOSE 96 03/28/2022   CHOL 105 03/28/2022   TRIG 81 03/28/2022   HDL 39 (L) 03/28/2022   LDLCALC 50 03/28/2022   ALT 45 (H) 03/28/2022   AST 31 03/28/2022   NA 140 03/28/2022   K 4.7 03/28/2022   CL 103 03/28/2022   CREATININE 0.69 (L) 03/28/2022   BUN 18 03/28/2022   CO2 27 03/28/2022   TSH 1.066 04/23/2016   INR 0.95 04/23/2016   HGBA1C 5.5 01/29/2018   Labs reviewed from primary care: A1c 6.5%. Cholesterol 137, triglycerides 52, HDL 34, LDL 92. Glucose 124.  Dated 07/10/17: cholesterol 113, triglycerides 68, HDL 31, LDL  68. A1c 6.1%. CBC and CMET normal.  Dated 04/23/18: normal chemistries and TSH.  Dated 10/29/18: A1c 5.4%.  Echo 01/29/18: Study Conclusions   - Left ventricle: The cavity size was normal. Systolic function was   normal. The estimated ejection fraction was in the range of 60%   to 65%. Wall motion was normal; there were no regional wall   motion abnormalities. Left ventricular diastolic function   parameters were normal.  Myoview 01/29/18: T wave inversion was noted during stress in the III, aVF, V3, V4, V5 and V6 leads, beginning at 1 minutes of stress, ending at 3 minutes of stress, and returning to baseline after 1-5 mins of recovery. Defect 1: There is a small defect of mild severity present in the apical anterior and apex location. Findings consistent with very mild ischemia in the LAD territory. This is a low risk study. The left ventricular ejection fraction is normal (55-65%).  Myoview 12/24/20: Study Highlights  Nuclear stress EF: 53%. Visually, the LV EF appears to be greater than 53% and appears normal . There was no ST segment deviation noted during stress. This is a low risk study. There is no evidence of ischemia or previous infarction. The study is normal.  Echo 01/12/21: IMPRESSIONS     1. Left ventricular  ejection fraction, by estimation, is 60 to 65%. The  left ventricle has normal function. The left ventricle has no regional  wall motion abnormalities. Left ventricular diastolic parameters were  normal.   2. Right ventricular systolic function is normal. The right ventricular  size is normal. There is normal pulmonary artery systolic pressure.   3. The mitral valve is grossly normal. No evidence of mitral valve  regurgitation.   4. The aortic valve is normal in structure. Aortic valve regurgitation is  not visualized.   Assessment / Plan: 1. Coronary disease status post stenting procedures as noted above.  Cardiac catheterization in July 2017 showed nonobstructive disease.  Myoview on 12/24/20 was normal.  Echo was normal. Continued DAPT since he has first generation DES. May hold Plavix for colonoscopy next week. He is cleared for procedure.   2. Morbid obesity. Encourage weight loss.    3. Hyperlipidemia. Continue lipitor 80 mg daily. Check labs today.   4. Hypertension-BP is well controlled.    5. DM type 2. Off medication now with weight loss. Will check A1c today.   I will follow up in one year

## 2023-07-31 ENCOUNTER — Ambulatory Visit: Payer: Managed Care, Other (non HMO) | Attending: Cardiology | Admitting: Cardiology

## 2023-07-31 ENCOUNTER — Encounter: Payer: Self-pay | Admitting: Cardiology

## 2023-07-31 VITALS — BP 128/82 | HR 68 | Ht 68.0 in | Wt 261.8 lb

## 2023-07-31 DIAGNOSIS — I10 Essential (primary) hypertension: Secondary | ICD-10-CM | POA: Diagnosis not present

## 2023-07-31 DIAGNOSIS — I25110 Atherosclerotic heart disease of native coronary artery with unstable angina pectoris: Secondary | ICD-10-CM | POA: Diagnosis not present

## 2023-07-31 DIAGNOSIS — E119 Type 2 diabetes mellitus without complications: Secondary | ICD-10-CM

## 2023-07-31 DIAGNOSIS — E785 Hyperlipidemia, unspecified: Secondary | ICD-10-CM | POA: Diagnosis not present

## 2023-07-31 NOTE — Patient Instructions (Signed)
Medication Instructions:  Hold Plavix until after colonoscopy Continue all other medications *If you need a refill on your cardiac medications before your next appointment, please call your pharmacy*   Lab Work: Cbc,cmet,lipid panel,A1c today   Testing/Procedures: None ordered   Follow-Up: At [facility], you and your health needs are our priority.  As part of our continuing mission to provide you with exceptional heart care, we have created designated Provider Care Teams.  These Care Teams include your primary Cardiologist (physician) and Advanced Practice Providers (APPs -  Physician Assistants and Nurse Practitioners) who all work together to provide you with the care you need, when you need it.  We recommend signing up for the patient portal called "MyChart".  Sign up information is provided on this After Visit Summary.  MyChart is used to connect with patients for Virtual Visits (Telemedicine).  Patients are able to view lab/test results, encounter notes, upcoming appointments, etc.  Non-urgent messages can be sent to your provider as well.   To learn more about what you can do with MyChart, go to [email or web address].    Your next appointment:  1 year     Call in June to schedule Oct appointment     Provider:  Dr.Jordan

## 2023-08-01 LAB — CBC WITH DIFFERENTIAL/PLATELET
Basophils Absolute: 0 10*3/uL (ref 0.0–0.2)
Basos: 1 %
EOS (ABSOLUTE): 0.1 10*3/uL (ref 0.0–0.4)
Eos: 2 %
Hematocrit: 50.5 % (ref 37.5–51.0)
Hemoglobin: 16.3 g/dL (ref 13.0–17.7)
Immature Grans (Abs): 0 10*3/uL (ref 0.0–0.1)
Immature Granulocytes: 0 %
Lymphocytes Absolute: 2 10*3/uL (ref 0.7–3.1)
Lymphs: 31 %
MCH: 31.9 pg (ref 26.6–33.0)
MCHC: 32.3 g/dL (ref 31.5–35.7)
MCV: 99 fL — ABNORMAL HIGH (ref 79–97)
Monocytes Absolute: 0.6 10*3/uL (ref 0.1–0.9)
Monocytes: 9 %
Neutrophils Absolute: 3.8 10*3/uL (ref 1.4–7.0)
Neutrophils: 57 %
Platelets: 187 10*3/uL (ref 150–450)
RBC: 5.11 x10E6/uL (ref 4.14–5.80)
RDW: 12 % (ref 11.6–15.4)
WBC: 6.6 10*3/uL (ref 3.4–10.8)

## 2023-08-01 LAB — COMPREHENSIVE METABOLIC PANEL
ALT: 63 IU/L — ABNORMAL HIGH (ref 0–44)
AST: 42 IU/L — ABNORMAL HIGH (ref 0–40)
Albumin: 4.4 g/dL (ref 3.9–4.9)
Alkaline Phosphatase: 85 IU/L (ref 44–121)
BUN/Creatinine Ratio: 28 — ABNORMAL HIGH (ref 10–24)
BUN: 20 mg/dL (ref 8–27)
Bilirubin Total: 0.7 mg/dL (ref 0.0–1.2)
CO2: 29 mmol/L (ref 20–29)
Calcium: 9.9 mg/dL (ref 8.6–10.2)
Chloride: 103 mmol/L (ref 96–106)
Creatinine, Ser: 0.71 mg/dL — ABNORMAL LOW (ref 0.76–1.27)
Globulin, Total: 2.1 g/dL (ref 1.5–4.5)
Glucose: 104 mg/dL — ABNORMAL HIGH (ref 70–99)
Potassium: 5.1 mmol/L (ref 3.5–5.2)
Sodium: 142 mmol/L (ref 134–144)
Total Protein: 6.5 g/dL (ref 6.0–8.5)
eGFR: 103 mL/min/{1.73_m2} (ref >59)

## 2023-08-01 LAB — LIPID PANEL
Cholesterol, Total: 142 mg/dL (ref 100–199)
HDL: 39 mg/dL — ABNORMAL LOW (ref >39)
LDL CALC COMMENT:: 3.6 ratio (ref 0.0–5.0)
LDL Chol Calc (NIH): 80 mg/dL (ref 0–99)
Triglycerides: 131 mg/dL (ref 0–149)
VLDL Cholesterol Cal: 23 mg/dL (ref 5–40)

## 2023-08-01 LAB — HEMOGLOBIN A1C
Est. average glucose Bld gHb Est-mCnc: 134 mg/dL
Hgb A1c MFr Bld: 6.3 % — ABNORMAL HIGH (ref 4.8–5.6)

## 2023-08-01 NOTE — Telephone Encounter (Signed)
Faxed over office note to [facility].

## 2023-08-03 ENCOUNTER — Telehealth: Payer: Self-pay | Admitting: Cardiology

## 2023-08-03 NOTE — Telephone Encounter (Signed)
Pt returning nurses call from 10/8 regarding results. Please advise

## 2023-08-03 NOTE — Telephone Encounter (Signed)
Pt did return call and results relayed. Pt verbalized understanding and no questions at this time.

## 2023-08-04 ENCOUNTER — Other Ambulatory Visit: Payer: Self-pay

## 2023-08-04 DIAGNOSIS — E785 Hyperlipidemia, unspecified: Secondary | ICD-10-CM

## 2023-08-04 DIAGNOSIS — I10 Essential (primary) hypertension: Secondary | ICD-10-CM

## 2023-08-04 DIAGNOSIS — I25110 Atherosclerotic heart disease of native coronary artery with unstable angina pectoris: Secondary | ICD-10-CM

## 2023-08-04 DIAGNOSIS — E119 Type 2 diabetes mellitus without complications: Secondary | ICD-10-CM

## 2023-09-15 ENCOUNTER — Other Ambulatory Visit: Payer: Self-pay | Admitting: Cardiology

## 2023-11-07 LAB — LIPID PANEL
Chol/HDL Ratio: 2.6 {ratio} (ref 0.0–5.0)
Cholesterol, Total: 102 mg/dL (ref 100–199)
HDL: 39 mg/dL — ABNORMAL LOW (ref >39)
LDL Chol Calc (NIH): 50 mg/dL (ref 0–99)
Triglycerides: 57 mg/dL (ref 0–149)
VLDL Cholesterol Cal: 13 mg/dL (ref 5–40)

## 2023-11-07 LAB — COMPREHENSIVE METABOLIC PANEL
ALT: 44 [IU]/L (ref 0–44)
AST: 32 [IU]/L (ref 0–40)
Albumin: 4.2 g/dL (ref 3.9–4.9)
Alkaline Phosphatase: 91 [IU]/L (ref 44–121)
BUN/Creatinine Ratio: 29 — ABNORMAL HIGH (ref 10–24)
BUN: 20 mg/dL (ref 8–27)
Bilirubin Total: 0.8 mg/dL (ref 0.0–1.2)
CO2: 23 mmol/L (ref 20–29)
Calcium: 9.4 mg/dL (ref 8.6–10.2)
Chloride: 104 mmol/L (ref 96–106)
Creatinine, Ser: 0.68 mg/dL — ABNORMAL LOW (ref 0.76–1.27)
Globulin, Total: 1.9 g/dL (ref 1.5–4.5)
Glucose: 97 mg/dL (ref 70–99)
Potassium: 4.9 mmol/L (ref 3.5–5.2)
Sodium: 142 mmol/L (ref 134–144)
Total Protein: 6.1 g/dL (ref 6.0–8.5)
eGFR: 104 mL/min/{1.73_m2} (ref >59)

## 2023-11-07 LAB — HEMOGLOBIN A1C
Est. average glucose Bld gHb Est-mCnc: 120 mg/dL
Hgb A1c MFr Bld: 5.8 % — ABNORMAL HIGH (ref 4.8–5.6)

## 2024-07-09 ENCOUNTER — Other Ambulatory Visit: Payer: Self-pay | Admitting: Cardiology

## 2024-07-29 ENCOUNTER — Encounter (HOSPITAL_COMMUNITY): Payer: Self-pay

## 2024-07-29 ENCOUNTER — Other Ambulatory Visit: Payer: Self-pay

## 2024-07-29 ENCOUNTER — Emergency Department (HOSPITAL_COMMUNITY)
Admission: EM | Admit: 2024-07-29 | Discharge: 2024-07-29 | Disposition: A | Discharge status: Home / Self Care | Attending: Emergency Medicine | Admitting: Emergency Medicine

## 2024-07-29 ENCOUNTER — Emergency Department (HOSPITAL_COMMUNITY)

## 2024-07-29 DIAGNOSIS — S0081XA Abrasion of other part of head, initial encounter: Secondary | ICD-10-CM | POA: Insufficient documentation

## 2024-07-29 DIAGNOSIS — R519 Headache, unspecified: Secondary | ICD-10-CM | POA: Insufficient documentation

## 2024-07-29 DIAGNOSIS — S4992XA Unspecified injury of left shoulder and upper arm, initial encounter: Secondary | ICD-10-CM | POA: Diagnosis present

## 2024-07-29 DIAGNOSIS — R1010 Upper abdominal pain, unspecified: Secondary | ICD-10-CM | POA: Diagnosis not present

## 2024-07-29 DIAGNOSIS — S43402A Unspecified sprain of left shoulder joint, initial encounter: Secondary | ICD-10-CM | POA: Insufficient documentation

## 2024-07-29 DIAGNOSIS — Z7982 Long term (current) use of aspirin: Secondary | ICD-10-CM | POA: Insufficient documentation

## 2024-07-29 DIAGNOSIS — S63502A Unspecified sprain of left wrist, initial encounter: Secondary | ICD-10-CM | POA: Insufficient documentation

## 2024-07-29 DIAGNOSIS — Z7902 Long term (current) use of antithrombotics/antiplatelets: Secondary | ICD-10-CM | POA: Insufficient documentation

## 2024-07-29 DIAGNOSIS — Y92410 Unspecified street and highway as the place of occurrence of the external cause: Secondary | ICD-10-CM | POA: Insufficient documentation

## 2024-07-29 DIAGNOSIS — S20212A Contusion of left front wall of thorax, initial encounter: Secondary | ICD-10-CM | POA: Insufficient documentation

## 2024-07-29 LAB — COMPREHENSIVE METABOLIC PANEL WITH GFR
ALT: 56 U/L — ABNORMAL HIGH (ref 0–44)
AST: 41 U/L (ref 15–41)
Albumin: 4.1 g/dL (ref 3.5–5.0)
Alkaline Phosphatase: 72 U/L (ref 38–126)
Anion gap: 13 (ref 5–15)
BUN: 23 mg/dL (ref 8–23)
CO2: 24 mmol/L (ref 22–32)
Calcium: 9.9 mg/dL (ref 8.9–10.3)
Chloride: 105 mmol/L (ref 98–111)
Creatinine, Ser: 0.98 mg/dL (ref 0.61–1.24)
GFR, Estimated: >60 mL/min (ref >60)
Glucose, Bld: 157 mg/dL — ABNORMAL HIGH (ref 70–99)
Potassium: 3.9 mmol/L (ref 3.5–5.1)
Sodium: 142 mmol/L (ref 135–145)
Total Bilirubin: 1.3 mg/dL — ABNORMAL HIGH (ref 0.0–1.2)
Total Protein: 6.9 g/dL (ref 6.5–8.1)

## 2024-07-29 LAB — CBC
HCT: 52.5 % — ABNORMAL HIGH (ref 39.0–52.0)
Hemoglobin: 17.5 g/dL — ABNORMAL HIGH (ref 13.0–17.0)
MCH: 31.9 pg (ref 26.0–34.0)
MCHC: 33.3 g/dL (ref 30.0–36.0)
MCV: 95.8 fL (ref 80.0–100.0)
Platelets: 198 K/uL (ref 150–400)
RBC: 5.48 MIL/uL (ref 4.22–5.81)
RDW: 12.3 % (ref 11.5–15.5)
WBC: 13.8 K/uL — ABNORMAL HIGH (ref 4.0–10.5)
nRBC: 0 % (ref 0.0–0.2)

## 2024-07-29 LAB — ETHANOL: Alcohol, Ethyl (B): <15 mg/dL (ref <15)

## 2024-07-29 LAB — PROTIME-INR
INR: 1 (ref 0.8–1.2)
Prothrombin Time: 13.4 s (ref 11.4–15.2)

## 2024-07-29 MED ORDER — IOHEXOL 350 MG/ML SOLN
75.0000 mL | Freq: Once | INTRAVENOUS | Status: AC | PRN
  Administered 2024-07-29: 75 mL via INTRAVENOUS

## 2024-07-29 NOTE — Progress Notes (Signed)
   07/29/24 1600  Spiritual Encounters  Type of Visit Initial;Attempt (pt unavailable)   Chaplain was paged to trauma level 2. Chaplain attempted to visit the patient, but he was unavailable. Chaplains remain available if needed.   M.Kubra Susanna Kerry Resident [phone]
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
## 2024-07-29 NOTE — Progress Notes (Signed)
 Orthopedic Tech Progress Note Patient Details:  Tim Heath 1961/02/07 [id number]  Ortho Devices Type of Ortho Device: Velcro wrist splint Ortho Device/Splint Location: LUE Ortho Device/Splint Interventions: Ordered, Application   Post Interventions Patient Tolerated: Well  Tim Heath A Asmara Backs 07/29/2024, 7:26 PM
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
## 2024-09-16 ENCOUNTER — Ambulatory Visit: Attending: Cardiology | Admitting: Cardiology

## 2024-09-16 ENCOUNTER — Encounter: Payer: Self-pay | Admitting: Cardiology

## 2024-09-16 VITALS — BP 134/84 | HR 71 | Ht 68.0 in | Wt 246.6 lb

## 2024-09-16 DIAGNOSIS — I10 Essential (primary) hypertension: Secondary | ICD-10-CM | POA: Diagnosis not present

## 2024-09-16 DIAGNOSIS — E119 Type 2 diabetes mellitus without complications: Secondary | ICD-10-CM | POA: Diagnosis not present

## 2024-09-16 DIAGNOSIS — E785 Hyperlipidemia, unspecified: Secondary | ICD-10-CM

## 2024-09-16 DIAGNOSIS — I25110 Atherosclerotic heart disease of native coronary artery with unstable angina pectoris: Secondary | ICD-10-CM

## 2024-09-16 NOTE — Patient Instructions (Addendum)
 Medication Instructions:  Continue same medications *If you need a refill on your cardiac medications before your next appointment, please call your pharmacy*  Lab Work: None ordered  Testing/Procedures: None ordered  Follow-Up: At [facility], you and your health needs are our priority.  As part of our continuing mission to provide you with exceptional heart care, our providers are all part of one team.  This team includes your primary Cardiologist (physician) and Advanced Practice Providers or APPs (Physician Assistants and Nurse Practitioners) who all work together to provide you with the care you need, when you need it.  Your next appointment:  1 year     Call in August to schedule Nov appointment     Provider:  Dr.Jordan   We recommend signing up for the patient portal called MyChart.  Sign up information is provided on this After Visit Summary.  MyChart is used to connect with patients for Virtual Visits (Telemedicine).  Patients are able to view lab/test results, encounter notes, upcoming appointments, etc.  Non-urgent messages can be sent to your provider as well.   To learn more about what you can do with MyChart, go to [email or web address].

## 2024-10-08 ENCOUNTER — Other Ambulatory Visit: Payer: Self-pay | Admitting: Cardiology

## 2024-10-14 ENCOUNTER — Other Ambulatory Visit: Payer: Self-pay | Admitting: Cardiology
